# Patient Record
Sex: Male | Born: 1970 | Race: Black or African American | Hispanic: No | Marital: Single | Smoking: Never smoker
Health system: Southern US, Community
[De-identification: ages and names within clinical notes are randomized; demographics above are authoritative.]

## PROBLEM LIST (undated history)

## (undated) DIAGNOSIS — I10 Essential (primary) hypertension: Secondary | ICD-10-CM

## (undated) DIAGNOSIS — Z8679 Personal history of other diseases of the circulatory system: Secondary | ICD-10-CM

## (undated) DIAGNOSIS — M329 Systemic lupus erythematosus, unspecified: Secondary | ICD-10-CM

## (undated) DIAGNOSIS — N189 Chronic kidney disease, unspecified: Secondary | ICD-10-CM

## (undated) HISTORY — DX: Systemic lupus erythematosus, unspecified: M32.9

## (undated) HISTORY — PX: CHOLECYSTECTOMY: SHX55

## (undated) HISTORY — DX: Personal history of other diseases of the circulatory system: Z86.79

## (undated) HISTORY — DX: Essential (primary) hypertension: I10

## (undated) HISTORY — DX: Chronic kidney disease, unspecified: N18.9

---

## 1999-07-08 ENCOUNTER — Emergency Department (HOSPITAL_COMMUNITY): Admission: EM | Admit: 1999-07-08 | Discharge: 1999-07-08 | Payer: Self-pay | Admitting: Emergency Medicine

## 1999-07-09 ENCOUNTER — Encounter: Payer: Self-pay | Admitting: Emergency Medicine

## 1999-11-28 ENCOUNTER — Emergency Department (HOSPITAL_COMMUNITY): Admission: EM | Admit: 1999-11-28 | Discharge: 1999-11-28 | Payer: Self-pay | Admitting: Emergency Medicine

## 1999-11-28 ENCOUNTER — Encounter: Payer: Self-pay | Admitting: Emergency Medicine

## 2014-07-26 ENCOUNTER — Encounter: Payer: Self-pay | Admitting: *Deleted

## 2014-07-26 NOTE — PMR Pre-admission (Signed)
[facility] PMR Admission Coordinator Pre-Admission Assessment  Patient: Thomas Andrews is an 43 y.o., male MRN: [id number] DOB: 02/17/1971 Height: 5' 10" (177.8 cm) Weight: 81.647 kg (180 lb)  Insurance Information HMO:     PPO: yes     PCP:      IPA:      80/20:      OTHER:  PRIMARY: [place]      Policy#: [id number]      Subscriber: self CM Name: Phoebe Sharps RN      Phone#: [phone], ext. [phone]     Fax#: [phone] Authorization given for seven days on 07-26-14 from Deb/Paula. However, pt had further medical issues and was admitted to inpatient rehab on 07-31-14. I then spoke with Joelene Millin, RN on 07-31-14 to share updates on pt status and authorization details. Authorization confirmed with Nevin Bloodgood via Joelene Millin and now approval is from 07-31-14 through 08-07-14 with updates due to [place] on 08-07-14 at above fax. Pre-Cert#: [id number]      Employer: [employer] Benefits:  Phone #: [phone]     Name: Bo Eff. Date: 10-26-13     Deduct: $750 (met all)      Out of Pocket Max: [phone] (met $717.42)      Life Max: unlimited CIR: 80/20%; pre-auth needed      SNF: 80/20%, pre-auth needed Outpatient: 80%     Co-Pay: 20%, 70 visits total for both PT/OT and 20 visits total for SLP Home Health: 80%      Co-Pay: 20%, 100 visit limit, pre-auth needed DME: 80%     Co-Pay: 20% Providers: in network  Emergency Contact Information Contact Information   Name Relation Home Work Bangs Mother [phone]  [phone]      Current Medical History  Patient Admitting Diagnosis: Deconditioning due to acute hypoxic respiratory failure secondary to diffuse alveolar hemorrhage History of Present Illness: This 43 year old male with a history of Lupus, CKD with unknown baseline creatinine and cardiomyopathy presented to [facility] ED on 07-07-14 with shortness of breath and cough for a couple of weeks and scant hemoptysis. Pt had also noted swelling to both lower legs extending to the  thigh. Pt then had acute hypoxic hypercarbic respiratory failure secondary to diffuse alveolar hemorrhage. Pt was intubated twice (last extubated 07-20-14) and had a 3 day course of [place], cyclophosphamide, rituximab and amicar. Pt had no further episodes of hemoptysis since 07-18-14. 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 Pt needed dialysis and last hemodialysis treatment was on 07-24-14 through [facility]. Creatinine has been stable. Pt has chronic HTN that was exacerbated by ARF and SLE flare as well as volume overload. Pt received 3 rounds of PLEX (plasma exchange) and 3 days of pulse dose steroids. On 07-27-14, pt had fever and inpatient rehab admission was delayed. Then, patient needed two units PRBCs and Hgb is now stable.  Pt has been progressing well with therapies and inpatient rehab was recommended.   Patient's medical record from [facility] has been reviewed by the rehabilitation admission coordinator and physician.  Past Medical History  No past medical history on file.  Family History  family history is not on file.  Prior Rehab/Hospitalizations: none   Current Medications  see MAR  Patients Current Diet:  Regular diet  Precautions / Restrictions Precautions Precautions: Fall Restrictions Weight Bearing Restrictions: No   Prior Activity Level Community (5-7x/wk): Pt was independent prior  to getting sick, working full times as a Charity fundraiser in [place]. He is recently divorced and sees his 56 yo daughter several days a week. 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  Home Assistive Devices / Equipment Home Assistive Devices/Equipment: None   Prior Functional Level Current Functional Level  Bed Mobility  Independent  Min assist (HOB elevated, used bed rails and increased time)   Transfers  Independent  Mod assist (Moderate  assist sit to stand, controls descent with UE's, pr)   Mobility - Walk/Wheelchair  Independent  Mod assist (Moderate assist 120' with Swedish walker) Improved to minimal assist with rolling walker  Upper Body Dressing  Independent  Other (overall ADL status from Mod assist to set up)   Lower Body Dressing  Independent  Min assist (Min assist for threading foot through pant hole, cannot stand)   Grooming  Independent  Other (set up assistance)   Eating/Drinking  Independent  Other (set up assistance)   Toilet Transfer  Independent  Mod assist (Moderate assist to [facility])   Bladder Continence   WFL  continent, using urinal   Bowel Management  WFL  continent, last BM on 07-30-14   Stair Climbing   Independent  Other (not assessed, anticipate needs)   Communication  [facility]  [facility]   Memory  [facility]  WFL   Cooking/Meal Prep  Independent      Housework  Independent    Money Management  Independent    Driving   Independent     Special needs/care consideration BiPAP/CPAP no CPM no  Continuous Drip IV no  Dialysis - no (pt's last dialysis was on 07-24-14)         Life Vest no  Oxygen no  Special Bed no  Trach Size no  Wound Vac (area) no       Skin - no issues                               Bowel mgmt: last BM on 07-30-14 Bladder mgmt: currently using urinal Diabetic mgmt no  Previous Home Environment Living Arrangements: Alone  Lives With: Alone Available Help at Discharge: Family;Available 24 hours/day Type of Home: Apartment (3rd floor apartment, plans to be moving out soon) Home Layout: One level Home Access: Stairs to enter Entrance Stairs-Rails: Left Entrance Stairs-Number of Steps: 3 flights to 3rd floor Additional Comments: Pt states he was able to break his apartment lease due to medical issues (lives on 3rd floor apartment), plans to stay with Mom until recovered and then get a new place.  Discharge Living Setting Plans for Discharge Living  Setting: Other (Comment) (plan is to go to mother's house) Type of Home at Discharge: House Discharge Home Layout: One level Discharge Home Access: Stairs to enter Entrance Stairs-Rails: None Entrance Stairs-Number of Steps: 1 Does the patient have any problems obtaining your medications?: No  Social/Family/Support Systems Patient Roles: Parent;Other (Comment) (works full time as Charity fundraiser and runs his own trucking business on the side with a friend) Sport and exercise psychologist Information: mother Dyann Kief is primary contact Anticipated Caregiver: mother Anticipated Ambulance person Information: see above Ability/Limitations of Caregiver: no limitations Caregiver Availability: 24/7 Discharge Plan Discussed with Primary Caregiver: Yes (discussed with Mom and pt on 07-26-14, 07-27-14, 07-30-14 and 07-31-14) Is Caregiver In Agreement with Plan?: Yes Does Caregiver/Family have Issues with Lodging/Transportation  while Pt is in Rehab?: No  Goals/Additional Needs Patient/Family Goal for Rehab: Supervision and Mod Ind with PT/OT; NA for SLP Expected length of stay: 10-14 days Cultural Considerations: none Dietary Needs: regular diet Equipment Needs: to be determined Pt/Family Agrees to Admission and willing to participate: Yes Program Orientation Provided & Reviewed with Pt/Caregiver Including Roles  & Responsibilities: Yes  Patient Condition: I met with pt and his mother at [facility] on 07-26-14 to explain the possibility of acute inpatient rehab. 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 He has a history of lupus and has had an involved medical course involving respiratory/renal issues since being admitted to [facility] on 07-07-14. Pt is currently needing minimal help with bed mobility and moderate assistance to stand and ambulate with a Swedish walker. He is demonstrating  proximal muscle weakness in his legs making the sit to stand transition difficult. He is also needing minimal to moderate help with self care tasks due to generalized weakness. Patient will benefit greatly from the multi-disciplinary team of skilled PT, OT, SLP and rehab nursing to maximize his functional return following this involved medical course of respiratory failure and renal issues. PT, OT and rehab nursing will focus on increasing strength for greater independence with bed mobility, transfers, gait and self care tasks. In addition, rehab nursing can educate pt/family on medication needs and any issues with bowel/bladder training. Pt will benefit from rehab physician intervention to monitor his renal function and overall medical status in light of lupus. Discussed case with Dr. Swartz who feels that pt is a good inpatient rehab candidate. Pt and his mother are motivated to come to inpatient rehab and will benefit from the intensive services of skilled therapy under rehab physician guidance. Pt will be admitted today on   Preadmission Screen Completed By:  Janine Turnington, PT, 07/31/2014 1111am ______________________________________________________________________   Discussed status with Dr. Swartz on 07-31-14 at 1111 and received telephone approval for admission today.  Admission Coordinator:  Janine Turnington, PT, time 1111/Date 07-31-14   Assessment/Plan: Diagnosis: debilitation after respiratory failure and multiple other medical problems 1. Does the need for close, 24 hr/day  Medical supervision in concert with the patient's rehab needs make it unreasonable for this patient to be served in a less intensive setting? Yes 2. Co-Morbidities requiring supervision/potential complications: ckd, lups, CM 3. Due to bladder management, bowel management, safety, skin/wound care, disease management, medication administration, pain management and patient education, does the patient require 24 hr/day rehab  nursing? Yes 4. Does the patient require coordinated care of a physician, rehab nurse, PT (1-2 hrs/day, 5 days/week) and OT (1-2 hrs/day, 5 days/week) to address physical and functional deficits in the context of the above medical diagnosis(es)? Yes Addressing deficits in the following areas: balance, endurance, locomotion, strength, transferring, bowel/bladder control, bathing, dressing, feeding, grooming, toileting and psychosocial support 5. Can the patient actively participate in an intensive therapy program of at least 3 hrs of therapy 5 days a week? Yes 6. The potential for patient to make measurable gains while on inpatient rehab is excellent 7. Anticipated functional outcomes upon discharge from inpatients are: modified independent PT, modified independent OT, n/a SLP 8. Estimated rehab length of stay to reach the above functional goals is: 10-14 days 9. Does the patient have adequate social supports to accommodate these discharge functional goals? Yes 10. Anticipated D/C setting: Home 11. Anticipated   post D/C treatments: HH therapy and Outpatient therapy 12. Overall Rehab/Functional Prognosis: excellent    RECOMMENDATIONS: This patient's condition is appropriate for continued rehabilitative care in the following setting: CIR Patient has agreed to participate in recommended program. Yes Note that insurance prior authorization may be required for reimbursement for recommended care.  Comment: Admit to inpatient rehab today  Zachary T. Swartz, MD, FAAPMR [place] [facility] 07/31/2014   Janine Turnington, PT 07/31/2014 

## 2014-07-31 ENCOUNTER — Other Ambulatory Visit: Payer: Self-pay | Admitting: Physical Medicine and Rehabilitation

## 2014-07-31 ENCOUNTER — Inpatient Hospital Stay (HOSPITAL_COMMUNITY)
Admission: RE | Admit: 2014-07-31 | Discharge: 2014-08-01 | DRG: 947 | Disposition: A | Discharge status: Other Acute Inpatient Hospital | Payer: BC Managed Care – PPO | Source: Other Acute Inpatient Hospital | Attending: Physical Medicine & Rehabilitation | Admitting: Physical Medicine & Rehabilitation

## 2014-07-31 ENCOUNTER — Encounter: Payer: Self-pay | Admitting: Physical Medicine and Rehabilitation

## 2014-07-31 DIAGNOSIS — I129 Hypertensive chronic kidney disease with stage 1 through stage 4 chronic kidney disease, or unspecified chronic kidney disease: Secondary | ICD-10-CM

## 2014-07-31 DIAGNOSIS — M3213 Lung involvement in systemic lupus erythematosus: Secondary | ICD-10-CM

## 2014-07-31 DIAGNOSIS — D61818 Other pancytopenia: Secondary | ICD-10-CM

## 2014-07-31 DIAGNOSIS — D696 Thrombocytopenia, unspecified: Secondary | ICD-10-CM | POA: Diagnosis present

## 2014-07-31 DIAGNOSIS — N189 Chronic kidney disease, unspecified: Secondary | ICD-10-CM

## 2014-07-31 DIAGNOSIS — E43 Unspecified severe protein-calorie malnutrition: Secondary | ICD-10-CM | POA: Diagnosis not present

## 2014-07-31 DIAGNOSIS — N179 Acute kidney failure, unspecified: Secondary | ICD-10-CM | POA: Diagnosis present

## 2014-07-31 DIAGNOSIS — B441 Other pulmonary aspergillosis: Secondary | ICD-10-CM | POA: Diagnosis present

## 2014-07-31 DIAGNOSIS — M3214 Glomerular disease in systemic lupus erythematosus: Secondary | ICD-10-CM | POA: Diagnosis present

## 2014-07-31 DIAGNOSIS — R0489 Hemorrhage from other sites in respiratory passages: Secondary | ICD-10-CM | POA: Diagnosis not present

## 2014-07-31 DIAGNOSIS — M329 Systemic lupus erythematosus, unspecified: Secondary | ICD-10-CM

## 2014-07-31 DIAGNOSIS — R042 Hemoptysis: Secondary | ICD-10-CM

## 2014-07-31 DIAGNOSIS — D62 Acute posthemorrhagic anemia: Secondary | ICD-10-CM | POA: Diagnosis present

## 2014-07-31 DIAGNOSIS — M3219 Other organ or system involvement in systemic lupus erythematosus: Secondary | ICD-10-CM

## 2014-07-31 DIAGNOSIS — R0902 Hypoxemia: Secondary | ICD-10-CM

## 2014-07-31 DIAGNOSIS — J679 Hypersensitivity pneumonitis due to unspecified organic dust: Secondary | ICD-10-CM | POA: Diagnosis not present

## 2014-07-31 DIAGNOSIS — R5381 Other malaise: Secondary | ICD-10-CM | POA: Diagnosis present

## 2014-07-31 DIAGNOSIS — R339 Retention of urine, unspecified: Secondary | ICD-10-CM | POA: Diagnosis not present

## 2014-07-31 LAB — GLUCOSE, CAPILLARY
Glucose-Capillary: 136 mg/dL — ABNORMAL HIGH (ref 70–99)
Glucose-Capillary: 159 mg/dL — ABNORMAL HIGH (ref 70–99)

## 2014-07-31 MED ORDER — ACETAMINOPHEN 325 MG PO TABS: 325.0000 mg | ORAL_TABLET | ORAL | Status: DC | PRN

## 2014-07-31 MED ORDER — POLYETHYLENE GLYCOL 3350 17 G PO PACK
17.0000 g | PACK | Freq: Every day | ORAL | Status: DC
  Administered 2014-07-31 – 2014-08-01 (×2): 17 g via ORAL
  Filled 2014-07-31 (×3): qty 1

## 2014-07-31 MED ORDER — PREDNISONE 50 MG PO TABS: 50.0000 mg | ORAL_TABLET | Freq: Every day | ORAL | Status: DC

## 2014-07-31 MED ORDER — HYDRALAZINE HCL 50 MG PO TABS
50.0000 mg | ORAL_TABLET | Freq: Three times a day (TID) | ORAL | Status: DC
  Administered 2014-07-31 – 2014-08-01 (×5): 50 mg via ORAL
  Filled 2014-07-31 (×6): qty 1

## 2014-07-31 MED ORDER — ATOVAQUONE 750 MG/5ML PO SUSP
1500.0000 mg | Freq: Every day | ORAL | Status: DC
  Administered 2014-08-01: 1500 mg via ORAL
  Filled 2014-07-31 (×2): qty 10

## 2014-07-31 MED ORDER — BISACODYL 10 MG RE SUPP: 10.0000 mg | Freq: Every day | RECTAL | Status: DC | PRN

## 2014-07-31 MED ORDER — HYDROXYCHLOROQUINE SULFATE 200 MG PO TABS
400.0000 mg | ORAL_TABLET | Freq: Every day | ORAL | Status: DC
  Administered 2014-08-01: 400 mg via ORAL
  Filled 2014-07-31 (×2): qty 2

## 2014-07-31 MED ORDER — ALBUTEROL SULFATE (2.5 MG/3ML) 0.083% IN NEBU
2.5000 mg | INHALATION_SOLUTION | RESPIRATORY_TRACT | Status: DC | PRN
  Administered 2014-08-01: 2.5 mg via RESPIRATORY_TRACT
  Filled 2014-07-31 (×2): qty 3

## 2014-07-31 MED ORDER — FLEET ENEMA 7-19 GM/118ML RE ENEM: 1.0000 | ENEMA | Freq: Once | RECTAL | Status: AC | PRN

## 2014-07-31 MED ORDER — INSULIN ASPART 100 UNIT/ML ~~LOC~~ SOLN: 0.0000 [IU] | Freq: Every day | SUBCUTANEOUS | Status: DC

## 2014-07-31 MED ORDER — CALCIUM CARBONATE-VITAMIN D 500-200 MG-UNIT PO TABS
1.0000 | ORAL_TABLET | Freq: Every day | ORAL | Status: DC
  Administered 2014-08-01: 1 via ORAL
  Filled 2014-07-31 (×2): qty 1

## 2014-07-31 MED ORDER — AMLODIPINE BESYLATE 10 MG PO TABS
10.0000 mg | ORAL_TABLET | Freq: Every day | ORAL | Status: DC
  Administered 2014-08-01: 10 mg via ORAL
  Filled 2014-07-31 (×2): qty 1

## 2014-07-31 MED ORDER — PREDNISONE 50 MG PO TABS
80.0000 mg | ORAL_TABLET | Freq: Every day | ORAL | Status: DC
  Administered 2014-08-01: 80 mg via ORAL
  Filled 2014-07-31 (×2): qty 1

## 2014-07-31 MED ORDER — GUAIFENESIN-DM 100-10 MG/5ML PO SYRP
5.0000 mL | ORAL_SOLUTION | Freq: Four times a day (QID) | ORAL | Status: DC | PRN
  Administered 2014-08-01: 10 mL via ORAL
  Filled 2014-07-31: qty 10

## 2014-07-31 MED ORDER — LEVOTHYROXINE SODIUM 150 MCG PO TABS
150.0000 ug | ORAL_TABLET | Freq: Every day | ORAL | Status: DC
Start: 2014-08-01 — End: 2014-08-01
  Administered 2014-08-01: 150 ug via ORAL
  Filled 2014-07-31 (×2): qty 1

## 2014-07-31 MED ORDER — VORICONAZOLE 200 MG PO TABS
200.0000 mg | ORAL_TABLET | Freq: Two times a day (BID) | ORAL | Status: DC
  Administered 2014-07-31 – 2014-08-01 (×3): 200 mg via ORAL
  Filled 2014-07-31 (×4): qty 1

## 2014-07-31 MED ORDER — ONDANSETRON HCL 4 MG PO TABS: 4.0000 mg | ORAL_TABLET | Freq: Four times a day (QID) | ORAL | Status: DC | PRN

## 2014-07-31 MED ORDER — PREDNISONE 50 MG PO TABS: 70.0000 mg | ORAL_TABLET | Freq: Every day | ORAL | Status: DC

## 2014-07-31 MED ORDER — INSULIN ASPART 100 UNIT/ML ~~LOC~~ SOLN
0.0000 [IU] | Freq: Three times a day (TID) | SUBCUTANEOUS | Status: DC
  Administered 2014-08-01: 2 [IU] via SUBCUTANEOUS

## 2014-07-31 MED ORDER — CARVEDILOL 25 MG PO TABS
25.0000 mg | ORAL_TABLET | Freq: Two times a day (BID) | ORAL | Status: DC
  Administered 2014-07-31 – 2014-08-01 (×3): 25 mg via ORAL
  Filled 2014-07-31 (×5): qty 1

## 2014-07-31 MED ORDER — ALUM & MAG HYDROXIDE-SIMETH 200-200-20 MG/5ML PO SUSP: 30.0000 mL | ORAL | Status: DC | PRN

## 2014-07-31 MED ORDER — ONDANSETRON HCL 4 MG/2ML IJ SOLN: 4.0000 mg | Freq: Four times a day (QID) | INTRAMUSCULAR | Status: DC | PRN

## 2014-07-31 MED ORDER — TRAZODONE HCL 50 MG PO TABS
25.0000 mg | ORAL_TABLET | Freq: Every evening | ORAL | Status: DC | PRN
Start: 2014-07-31 — End: 2014-08-01
  Administered 2014-07-31: 50 mg via ORAL
  Filled 2014-07-31: qty 1

## 2014-07-31 MED ORDER — SENNOSIDES-DOCUSATE SODIUM 8.6-50 MG PO TABS
2.0000 | ORAL_TABLET | Freq: Two times a day (BID) | ORAL | Status: DC
  Administered 2014-08-01: 2 via ORAL
  Filled 2014-07-31 (×2): qty 2

## 2014-07-31 MED ORDER — SODIUM CHLORIDE 0.9 % IV SOLN: 1000.0000 mg | Freq: Once | INTRAVENOUS | Status: DC

## 2014-07-31 MED ORDER — PANTOPRAZOLE SODIUM 40 MG PO TBEC
40.0000 mg | DELAYED_RELEASE_TABLET | Freq: Every day | ORAL | Status: DC
  Administered 2014-08-01: 40 mg via ORAL
  Filled 2014-07-31: qty 1

## 2014-07-31 MED ORDER — DOXAZOSIN MESYLATE 2 MG PO TABS
2.0000 mg | ORAL_TABLET | Freq: Two times a day (BID) | ORAL | Status: DC
  Administered 2014-07-31 – 2014-08-01 (×3): 2 mg via ORAL
  Filled 2014-07-31 (×4): qty 1

## 2014-07-31 MED ORDER — ALBUTEROL SULFATE (2.5 MG/3ML) 0.083% IN NEBU: 2.5000 mg | INHALATION_SOLUTION | Freq: Every day | RESPIRATORY_TRACT | Status: DC

## 2014-07-31 MED ORDER — PREDNISONE 50 MG PO TABS: 60.0000 mg | ORAL_TABLET | Freq: Every day | ORAL | Status: DC

## 2014-07-31 NOTE — Progress Notes (Signed)
Patient ID: Sheralyn BoatmanDennis R Slaght, male   DOB: [birth year], 43 y.o.   MRN: [id number] Patient admitted to [phone] via stretcher, escorted by EMS staff.  Patient verbalized understanding of rehab process, signed fall safety plan.  Appears to be in no immediate distress at this time.  Will continue to monitor.  Dani Gobbleeardon, Hakeen Shipes J, RN

## 2014-07-31 NOTE — H&P (Signed)
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 Thomas Andrews is a 43 year old male with history of SLE with multisystem involvement who was admitted to [facility] on 07/07/14 with several weeks of SOB, BLE edema progressive to blood tinged productive cough, fever and chills. Patient reported to be on recent steroid taper by his rheumatologist (Dr. Janne Lab). He was started on IV antibiotics for patchy airspace disease and IV diuretics for edema. CT chest showed multifocal areas of consolidation and requires intubation with high frequency oscillation for respiratory failure. Bronchoscopy showed at bedside revealed diffuse blood staining the airways and he was started on high dose steroids for diffuse alveolar hemorrhage. Rheumatology was consulted for input and home azathioprine was discontinued and patient was treated with cytoxan 09/17 with plans to repeat every 4 weeks. 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 He was treated with high doses of prednisone with recommendations for slow taper. He underwent PLEX 9/15-9/18 and tolerated extubation on 09/19 and IV antibiotics were discontinued due to low suspicion of infection.  On 09/23, he developed acute worsening of DAH requiring re-intubation and 3 round of PLEX as well as Rituximab on 09/26 with recommendations for second dose in 2 weeks. Acute on chronic renal failure treated with CVVHD to iHD with improvement in renal status and UOP. Dialysis was discontinued by end of September with reports of good urine output and recommendations to utilize lasix prn for any volume issues. Renal biopsy was done on 9/30. 2D echo done revealing EF 50% with mild LVH and mild RV dysfunction. He started having fevers on 10/01 and cultures of bronch specimen  grew out aspergillus on 10/02 and patient also had candida in urine as well as oral thrush. Although though to be a contaminant due to immuno suppresion ID/Dr. Elie Goody recommended treating patient with Voriconazole 200mg  bid for at least 12 weeks with weekly monitoring of trough levels.  Blood pressures have been managed on oral medications and nicardipine drip weaned off. Recommendation for Rituximab 1000 mg on 10/09 and Cyclophosphamide 450 mg on 10/17 at [facility] infusion clinic. Plans for slow prednisone taper by 10 mg ever two weeks--next decrease to 70 mg/day on 10/14. He is to continue on PJP prophylaxis with bactrim DS MWF, plaquenil 400 mg/day and CCB for Raynaud's. Swallow evaluation done 09/30 showing no evidence of dysphagia and patient tolerating regular diet with aspiration precautions. Had pancytopenia with platelets down to 45, ANC 2.2 as well as drop in H/H to 6.7 on 10/03 and transfused with 2 units PRBC. Pancytopenia expected as SE of cytoxan and to transfuse platelets if <10. Hypocalcemia treated with calcium supplements. Therapy ongoing and patient showing improvement in tolerance and activity level. CIR recommended by rehab team for further therapy.   Review of Systems  HENT: Negative for ear discharge and hearing loss.  Eyes: Negative for blurred vision and double vision.  Respiratory: Negative for cough and shortness of breath.  Cardiovascular: Positive for leg swelling. Negative for chest pain and palpitations.  Gastrointestinal: Positive for abdominal pain (abdominal distension). Negative for heartburn, nausea and constipation.  Genitourinary: Negative for dysuria and hematuria.  Musculoskeletal: Positive for myalgias.  Neurological: Positive for focal weakness. Negative for sensory change and headaches.  Psychiatric/Behavioral: The patient is not nervous/anxious and does not have insomnia.   Past Medical  History   Diagnosis  Date   .  Systemic lupus erythematosus      with  pulmonary, renal, cardiac, skin and joint involvement   .  HTN (hypertension)    .  CKD (chronic kidney disease)    .  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  Cholecystectomy      No family history on file.  Social History: Lives alone in [place]. Works full time and independent PTA. Plans for discharge to mother's home. He does not use tobacco, alcohol, or illicit drugs.  Allergies   Allergen  Reactions   .  Levaquin [Levofloxacin In D5w]  Other (See Comments)     Rash and SOB     (Not in a hospital admission)  Home:  Functional History:  Lives alone. Works as a Nurse, adultmanufacturing engineer in a factory.  Functional Status:  Mobility:  Min assist for bed mobility  Moderate assist for transfer  Able to ambulate 6 feet with min assist and swedish walker.  ADL:  Moderate assist to set up overall  Min assist for lower body dressing.  Cognition:  Alert and oriented X 4.  Follow commands without difficulty.  Physical Exam:  There were no vitals taken for this visit.   Constitutional: He is oriented to person, place, and time. He appears well-developed and well-nourished.  HENT:  Head: Normocephalic and atraumatic.  Eyes: Conjunctivae and EOM are normal. Pupils are equal, round, and reactive to light.  Neck: Normal range of motion. Neck supple.  Cardiovascular: Regular rhythm. Tachycardia present.  Respiratory: Effort normal. No respiratory distress. He has decreased breath sounds in the right lower field and the left lower field. He has no wheezes. He has no rhonchi.  GI: Soft. Bowel sounds are normal. He exhibits no distension. There is no tenderness.  Musculoskeletal: He exhibits edema (2+ pedal edema. Trace to 1+ pitting edema from knees to ankles. ).  Neurological: He is alert and oriented to person, place, and time.  RUE: deltoid 5/5, bicep 5/5, tricep 5/5, wrist ext 5/5, hand intrinsics 5/5 LUE: deltoid 5/5, bicep 5/5,  triceps 5/5, wrist ext 5/5, hand instrincs 5/5 RLE: HF 2/5, KE 4-/5, KF 3+/5, ADF 4/5, APF 4/5 LLE: HF 2/5, KE 4-/5, HF, 3+/5, ADF 4/5, APF 4/5  Skin: Skin is warm and dry.  No skin breakdown noted. bx site healed.  Psychiatric: He has a normal mood and affect. His behavior is normal. Judgment and thought content normal.   Laboratory results from 07/30/14:  Na-136 K-4.1 Cl-106 Co2- 25 BUN-54 Cr-1.8 Ca- 6.6  AST-114 ALT- 128 Alb-1.9 Aphos-38 TP-0.5  Hgb- 9.7 WBC-3.7 Plt- 57  Medical Problem List and Plan:  1. Functional deficits secondary to Debilitation due to SLE flare with DAH, lupus nephritis and aspergillus lung infection.  2. DVT Prophylaxis/Anticoagulation: Mechanical: Antiembolism stockings, thigh (TED hose) Bilateral lower extremities  3. Pain Management: No complaints of pain reported.  4. Mood: LCSW to follow for evaluation and support.  5. Neuropsych: This patient is capable of making decisions on her own behalf.  6. Skin/Wound Care: Routine pressure relief measures. Monitor for any signs of breakdown.  7. Fluids/Electrolytes/Nutrition: Monitor I/O. Add nutritional supplements due to signs of severe malnutrition.  8. Aspergillus lung infection: Continue Voriconozole bid for 3 months with next next trough on 10/09--will check CBC/CMET in am and then weekly check of trough and CMET  starting 10/16.  9. CKD: Monitor UOP as well as recovery of renal status. Treat BLE edema with diuretics as indicated.  10. HTN: Will monitor every 8 hours. Continue Norvasc, Cardura, Hydralazine and Coreg.  11. SLE: Will continue plaquenil daily with mepron for PCP. Prednisone 80 mg till 10/15 then 70 mg X 2 weeks, etc for slow taper. Next dose of Rituxan on Friday and Ok to administer at [facility]. Next dose cytoxan on 10/15  12. Pancytopenia: Will monitor with routine check. Recheck CBC in am and treat appropriately.  13. Urinary retention: Will monitor PVRs. Cath for volumes > 350 cc. Bladder scan > 260 cc  but only 50 cc output--?ascites.  Post Admission Physician Evaluation:  1. Functional deficits secondary to debilitation due to SLE flare. 2. Patient is admitted to receive collaborative, interdisciplinary care between the physiatrist, rehab nursing staff, and therapy team. 3. Patient's level of medical complexity and substantial therapy needs in context of that medical necessity cannot be provided at a lesser intensity of care such as a SNF. 4. Patient has experienced substantial functional loss from his/her baseline which was documented above under the "Functional History" and "Functional Status" headings. Judging by the patient's diagnosis, physical exam, and functional history, the patient has potential for functional progress which will result in measurable gains while on inpatient rehab. These gains will be of substantial and practical use upon discharge in facilitating mobility and self-care at the household level. 5. Physiatrist will provide 24 hour management of medical needs as well as oversight of the therapy plan/treatment and provide guidance as appropriate regarding the interaction of the two. 6. 24 hour rehab nursing will assist with bladder management, bowel management, safety, skin/wound care, disease management, medication administration, pain management and patient education and help integrate therapy concepts, techniques,education, etc. 7. PT will assess and treat for/with: Lower extremity strength, range of motion, stamina, balance, functional mobility, safety, adaptive techniques and equipment, pain mgt, activity tolerance, . Goals are: mod I. 8. OT will assess and treat for/with: ADL's, functional mobility, safety, upper extremity strength, adaptive techniques and equipment, pain mgt, activity tolerance, leisure awareness. Goals are: mod I. Therapy may proceed with showering this patient. 9. SLP will assess and treat for/with: n/a. Goals are: n/a. 10. Case Management and Social  Worker will assess and treat for psychological issues and discharge planning. 11. Team conference will be held weekly to assess progress toward goals and to determine barriers to discharge. 12. Patient will receive at least 3 hours of therapy per day at least 5 days per week. 13. ELOS: 7-10 days  14. Prognosis: excellent  Ranelle Oyster, MD, [facility] 07/31/2014

## 2014-07-31 NOTE — Progress Notes (Signed)
Patients abdomen distended.  Bladder scanned for >265, had just voided in urinal 150 ml; straight cathed for 50 ml.  Notified Pam Love, PA of findings, ordered to monitor, and allow patient to try voiding again later.  Will continue to monitor.  Dani Gobbleeardon, Dallen Bunte J, RN

## 2014-07-31 NOTE — Progress Notes (Signed)
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 Levaquin [Levofloxacin In D5w] Other (See Comments)    Rash and SOB    Patient Measurements: Weight: 179 lb 6.4 oz (81.375 kg)  Vital Signs: Temp: 98.4 F (36.9 C) (10/06 1700) Temp Source: Oral (10/06 1700) BP: 129/88 mmHg (10/06 1700) Pulse Rate: 81 (10/06 1700) Intake/Output from previous day:   Intake/Output from this shift: Total I/O In: -  Out: 200 [Urine:200]  Labs:     Microbiology: No results found for this or any previous visit (from the past 720 hour(s)).  Medical History: Past Medical History  Diagnosis Date  . Systemic lupus erythematosus     with pulmonary, renal, cardiac, skin and joint involvement  . HTN (hypertension)   . CKD (chronic kidney disease)   . 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 albuterol (PROVENTIL HFA;VENTOLIN HFA) 108 (90 BASE) MCG/ACT inhaler Inhale 2 puffs into the lungs every 6 (six) hours as needed for wheezing or shortness of breath.      Marland Kitchen. amLODipine (NORVASC) 10 MG tablet Take 10 mg by mouth daily.      Marland Kitchen. atovaquone (MEPRON) 750 MG/5ML suspension Take 1,500 mg by mouth daily.      . calcium citrate-vitamin D (CITRACAL+D) 315-200 MG-UNIT per tablet Take 1 tablet by mouth daily.      . carvedilol (COREG) 25 MG tablet Take 25 mg by mouth 2 (two) times daily with a meal.      . doxazosin (CARDURA) 2 MG tablet Take 2 mg by mouth 2 (two) times daily.      . hydrALAZINE (APRESOLINE) 50 MG tablet Take 50 mg by mouth every 8 (eight) hours.      . hydroxychloroquine (PLAQUENIL) 200 MG tablet Take 400 mg by mouth daily.      Marland Kitchen. levothyroxine (SYNTHROID, LEVOTHROID) 150 MCG tablet Take 150 mcg by mouth daily before breakfast.      . Melatonin 3 MG CAPS Take 3 mg by mouth at bedtime.       . pantoprazole (PROTONIX) 40 MG tablet Take 40 mg by mouth daily.      . polyethylene glycol (MIRALAX / GLYCOLAX) packet Take 17 g by mouth daily as needed for moderate constipation.      . predniSONE (DELTASONE) 10 MG tablet Take 50-80 mg by mouth daily with breakfast. Take 80 mg by mouth daily until 08/09/2014. Take 70 mg by mouth daily from 08/10/2014-08/23/2014. Take 60 mg by mouth daily from 08/24/2014-09/06/2014. Take 50 mg by mouth daily from 09/07/2014-09/20/2014.      . sennosides-docusate sodium (SENOKOT-S) 8.6-50 MG tablet Take 2 tablets by mouth 2 (two) times daily.      Marland Kitchen. voriconazole (VFEND) 200 MG tablet Take 200 mg by mouth 2 (two) times daily.       Assessment: 43 yo M [facility] for SLE with multisystem involvement (pulm, cardiac, renal, skin, and joint). Primary complication at time of hospitalization was diffuse alveolar hemorrhage. Info below taken [facility]  1. 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 BAL from 9/13 showed DAH (common in lupus flares). Currently on high-dose steroid taper.   2. SLE with acute flare [facility]  continued home hydroxychloroquine and started on cytoxan 450 mg/m2 on 9/17 (every 4 weeks, next dose 10/17). Started high dose Prednisone, taper 10mg  every 2 weeks. Rituxan 1000mg  on 9/26 with next dose due 10/9. Continue Atovaquone for PJP px, Plaquenil, and CCB for Raynaud's.  **Follow up plan for Rituxan on Friday.  3. 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  4. Aspergillus in bronch washing, concern for systemic fungal infection given chronic immunosuppression. Bronch 9/13 resulted with aspergillus 10/2. ID was consulted and recommended Vfend 200mg  BID x 6-12 weeks. Goal trough 2-4 with weekly troughs and LFTs. Next trough 10/9. Fax labs to [phone] Dr.  Elie Goody.  **I restarted PTA dose. Follow-up plan for levels and [facility] ID to manage?   5. HF (EF 50%) 6. HTN - Amlo, Coreg, Hydral 7. Hypocalcemia - on Ca 8. Anemia  Goal of Therapy:  Goal Trough Level 2-4  Plan:  Voriconazole 200mg  PO BID. Follow up plans for levels (due 10/9) and fax to Dr. Lendon Colonel Follow up plans for Rituxan infusion on 10/9 per [facility]ology recs   [facility], Pharm.D., BCPS Clinical Pharmacist Pager [phone] 07/31/2014 5:42 PM

## 2014-08-01 ENCOUNTER — Inpatient Hospital Stay (HOSPITAL_COMMUNITY): Payer: BC Managed Care – PPO | Admitting: Occupational Therapy

## 2014-08-01 ENCOUNTER — Inpatient Hospital Stay (HOSPITAL_COMMUNITY): Payer: BC Managed Care – PPO | Admitting: Physical Therapy

## 2014-08-01 ENCOUNTER — Inpatient Hospital Stay (HOSPITAL_COMMUNITY)
Admission: AD | Admit: 2014-08-01 | Discharge: 2014-08-05 | DRG: 545 | Disposition: A | Discharge status: Other Acute Inpatient Hospital | Payer: BC Managed Care – PPO | Source: Ambulatory Visit | Attending: Pulmonary Disease | Admitting: Pulmonary Disease

## 2014-08-01 ENCOUNTER — Inpatient Hospital Stay (HOSPITAL_COMMUNITY): Payer: BC Managed Care – PPO

## 2014-08-01 DIAGNOSIS — M3213 Lung involvement in systemic lupus erythematosus: Principal | ICD-10-CM | POA: Diagnosis present

## 2014-08-01 DIAGNOSIS — R0489 Hemorrhage from other sites in respiratory passages: Secondary | ICD-10-CM | POA: Diagnosis present

## 2014-08-01 DIAGNOSIS — J679 Hypersensitivity pneumonitis due to unspecified organic dust: Secondary | ICD-10-CM | POA: Diagnosis not present

## 2014-08-01 DIAGNOSIS — G9341 Metabolic encephalopathy: Secondary | ICD-10-CM | POA: Diagnosis present

## 2014-08-01 DIAGNOSIS — N179 Acute kidney failure, unspecified: Secondary | ICD-10-CM | POA: Diagnosis present

## 2014-08-01 DIAGNOSIS — R042 Hemoptysis: Secondary | ICD-10-CM

## 2014-08-01 DIAGNOSIS — D689 Coagulation defect, unspecified: Secondary | ICD-10-CM | POA: Diagnosis present

## 2014-08-01 DIAGNOSIS — D696 Thrombocytopenia, unspecified: Secondary | ICD-10-CM | POA: Diagnosis present

## 2014-08-01 DIAGNOSIS — Z9289 Personal history of other medical treatment: Secondary | ICD-10-CM

## 2014-08-01 DIAGNOSIS — D62 Acute posthemorrhagic anemia: Secondary | ICD-10-CM | POA: Diagnosis present

## 2014-08-01 DIAGNOSIS — M3214 Glomerular disease in systemic lupus erythematosus: Secondary | ICD-10-CM | POA: Diagnosis present

## 2014-08-01 DIAGNOSIS — B441 Other pulmonary aspergillosis: Secondary | ICD-10-CM | POA: Diagnosis present

## 2014-08-01 DIAGNOSIS — N189 Chronic kidney disease, unspecified: Secondary | ICD-10-CM

## 2014-08-01 DIAGNOSIS — J9690 Respiratory failure, unspecified, unspecified whether with hypoxia or hypercapnia: Secondary | ICD-10-CM

## 2014-08-01 DIAGNOSIS — N183 Chronic kidney disease, stage 3 (moderate): Secondary | ICD-10-CM | POA: Diagnosis present

## 2014-08-01 DIAGNOSIS — M3219 Other organ or system involvement in systemic lupus erythematosus: Secondary | ICD-10-CM | POA: Diagnosis present

## 2014-08-01 DIAGNOSIS — R5381 Other malaise: Secondary | ICD-10-CM | POA: Diagnosis not present

## 2014-08-01 DIAGNOSIS — M329 Systemic lupus erythematosus, unspecified: Secondary | ICD-10-CM | POA: Diagnosis not present

## 2014-08-01 DIAGNOSIS — Z01818 Encounter for other preprocedural examination: Secondary | ICD-10-CM

## 2014-08-01 DIAGNOSIS — J9601 Acute respiratory failure with hypoxia: Secondary | ICD-10-CM | POA: Diagnosis present

## 2014-08-01 DIAGNOSIS — Z452 Encounter for adjustment and management of vascular access device: Secondary | ICD-10-CM

## 2014-08-01 DIAGNOSIS — I5022 Chronic systolic (congestive) heart failure: Secondary | ICD-10-CM | POA: Diagnosis present

## 2014-08-01 DIAGNOSIS — R609 Edema, unspecified: Secondary | ICD-10-CM

## 2014-08-01 DIAGNOSIS — J690 Pneumonitis due to inhalation of food and vomit: Secondary | ICD-10-CM | POA: Diagnosis not present

## 2014-08-01 DIAGNOSIS — I129 Hypertensive chronic kidney disease with stage 1 through stage 4 chronic kidney disease, or unspecified chronic kidney disease: Secondary | ICD-10-CM | POA: Diagnosis present

## 2014-08-01 DIAGNOSIS — D638 Anemia in other chronic diseases classified elsewhere: Secondary | ICD-10-CM | POA: Diagnosis present

## 2014-08-01 DIAGNOSIS — I5041 Acute combined systolic (congestive) and diastolic (congestive) heart failure: Secondary | ICD-10-CM

## 2014-08-01 LAB — GLUCOSE, CAPILLARY
GLUCOSE-CAPILLARY: 118 mg/dL — AB (ref 70–99)
Glucose-Capillary: 91 mg/dL (ref 70–99)
Glucose-Capillary: 161 mg/dL — ABNORMAL HIGH (ref 70–99)
Glucose-Capillary: 165 mg/dL — ABNORMAL HIGH (ref 70–99)

## 2014-08-01 LAB — COMPREHENSIVE METABOLIC PANEL
ALK PHOS: 47 U/L (ref 39–117)
ALT: 165 U/L — AB (ref 0–53)
AST: 151 U/L — ABNORMAL HIGH (ref 0–37)
Albumin: 2 g/dL — ABNORMAL LOW (ref 3.5–5.2)
Anion gap: 9 (ref 5–15)
BUN: 60 mg/dL — ABNORMAL HIGH (ref 6–23)
CO2: 24 meq/L (ref 19–32)
Calcium: 6.3 mg/dL — CL (ref 8.4–10.5)
Chloride: 105 mEq/L (ref 96–112)
Creatinine, Ser: 1.91 mg/dL — ABNORMAL HIGH (ref 0.50–1.35)
GFR calc Af Amer: 49 mL/min — ABNORMAL LOW (ref >90)
GFR, EST NON AFRICAN AMERICAN: 42 mL/min — AB (ref >90)
GLUCOSE: 89 mg/dL (ref 70–99)
POTASSIUM: 4.9 meq/L (ref 3.7–5.3)
Sodium: 138 mEq/L (ref 137–147)
Total Bilirubin: 0.3 mg/dL (ref 0.3–1.2)
Total Protein: 4.8 g/dL — ABNORMAL LOW (ref 6.0–8.3)

## 2014-08-01 LAB — BLOOD GAS, ARTERIAL
ACID-BASE DEFICIT: 3.8 mmol/L — AB (ref 0.0–2.0)
Bicarbonate: 19.7 mEq/L — ABNORMAL LOW (ref 20.0–24.0)
Drawn by: 252031
FIO2: 1 %
O2 SAT: 85.2 %
PO2 ART: 52 mmHg — AB (ref 80.0–100.0)
Patient temperature: 98.6
TCO2: 20.6 mmol/L (ref 0–100)
pCO2 arterial: 29.7 mmHg — ABNORMAL LOW (ref 35.0–45.0)
pH, Arterial: 7.437 (ref 7.350–7.450)

## 2014-08-01 LAB — CBC WITH DIFFERENTIAL/PLATELET
Basophils Absolute: 0 10*3/uL (ref 0.0–0.1)
Basophils Relative: 0 % (ref 0–1)
EOS ABS: 0 10*3/uL (ref 0.0–0.7)
EOS PCT: 0 % (ref 0–5)
HCT: 29.5 % — ABNORMAL LOW (ref 39.0–52.0)
HEMOGLOBIN: 9.8 g/dL — AB (ref 13.0–17.0)
LYMPHS ABS: 0.1 10*3/uL — AB (ref 0.7–4.0)
Lymphocytes Relative: 3 % — ABNORMAL LOW (ref 12–46)
MCH: 27.8 pg (ref 26.0–34.0)
MCHC: 33.2 g/dL (ref 30.0–36.0)
MCV: 83.6 fL (ref 78.0–100.0)
MONOS PCT: 2 % — AB (ref 3–12)
Monocytes Absolute: 0.1 10*3/uL (ref 0.1–1.0)
Neutro Abs: 3.8 10*3/uL (ref 1.7–7.7)
Neutrophils Relative %: 95 % — ABNORMAL HIGH (ref 43–77)
Platelets: 71 10*3/uL — ABNORMAL LOW (ref 150–400)
RBC: 3.53 MIL/uL — ABNORMAL LOW (ref 4.22–5.81)
RDW: 18.4 % — ABNORMAL HIGH (ref 11.5–15.5)
WBC: 4 10*3/uL (ref 4.0–10.5)

## 2014-08-01 LAB — CALCIUM, IONIZED: Calcium, Ion: 0.97 mmol/L — ABNORMAL LOW (ref 1.12–1.23)

## 2014-08-01 MED ORDER — ATOVAQUONE 750 MG/5ML PO SUSP
1500.0000 mg | Freq: Every day | ORAL | Status: DC
  Administered 2014-08-02 – 2014-08-05 (×4): 1500 mg
  Filled 2014-08-01 (×5): qty 10

## 2014-08-01 MED ORDER — PANTOPRAZOLE SODIUM 40 MG IV SOLR
40.0000 mg | Freq: Every day | INTRAVENOUS | Status: DC
  Administered 2014-08-02 – 2014-08-04 (×3): 40 mg via INTRAVENOUS
  Filled 2014-08-01 (×3): qty 40

## 2014-08-01 MED ORDER — LEVOTHYROXINE SODIUM 100 MCG IV SOLR
75.0000 ug | Freq: Every day | INTRAVENOUS | Status: DC
  Administered 2014-08-02 – 2014-08-04 (×3): 75 ug via INTRAVENOUS
  Filled 2014-08-01 (×3): qty 5

## 2014-08-01 MED ORDER — SENNA 8.6 MG PO TABS
2.0000 | ORAL_TABLET | Freq: Two times a day (BID) | ORAL | Status: DC
  Filled 2014-08-01: qty 2

## 2014-08-01 MED ORDER — MIDAZOLAM HCL 2 MG/2ML IJ SOLN
INTRAMUSCULAR | Status: AC
  Administered 2014-08-02: 2 mg via INTRAVENOUS
  Filled 2014-08-01: qty 2

## 2014-08-01 MED ORDER — METHYLPREDNISOLONE SODIUM SUCC 125 MG IJ SOLR
60.0000 mg | Freq: Four times a day (QID) | INTRAMUSCULAR | Status: DC
  Filled 2014-08-01 (×2): qty 0.96

## 2014-08-01 MED ORDER — ALBUTEROL SULFATE (2.5 MG/3ML) 0.083% IN NEBU
2.5000 mg | INHALATION_SOLUTION | Freq: Every day | RESPIRATORY_TRACT | Status: DC
  Administered 2014-08-01: 2.5 mg via RESPIRATORY_TRACT
  Filled 2014-08-01: qty 3

## 2014-08-01 MED ORDER — IPRATROPIUM-ALBUTEROL 0.5-2.5 (3) MG/3ML IN SOLN
3.0000 mL | Freq: Four times a day (QID) | RESPIRATORY_TRACT | Status: DC
Start: 2014-08-02 — End: 2014-08-05
  Administered 2014-08-02 – 2014-08-05 (×15): 3 mL via RESPIRATORY_TRACT
  Filled 2014-08-01 (×16): qty 3

## 2014-08-01 MED ORDER — FENTANYL CITRATE 0.05 MG/ML IJ SOLN
100.0000 ug | INTRAMUSCULAR | Status: DC | PRN
Start: 2014-08-01 — End: 2014-08-02
  Filled 2014-08-01: qty 2

## 2014-08-01 MED ORDER — SODIUM CHLORIDE 0.9 % IV SOLN: 250.0000 mL | INTRAVENOUS | Status: DC | PRN

## 2014-08-01 MED ORDER — PROPOFOL 10 MG/ML IV EMUL
INTRAVENOUS | Status: AC
  Administered 2014-08-01: 10 ug/kg/min via INTRAVENOUS
  Filled 2014-08-01: qty 50

## 2014-08-01 MED ORDER — FENTANYL CITRATE 0.05 MG/ML IJ SOLN
INTRAMUSCULAR | Status: AC
  Administered 2014-08-01: 100 ug via INTRAVENOUS
  Filled 2014-08-01: qty 2

## 2014-08-01 MED ORDER — PROPOFOL 10 MG/ML IV EMUL
0.0000 ug/kg/min | INTRAVENOUS | Status: DC
  Administered 2014-08-01: 10 ug/kg/min via INTRAVENOUS
  Administered 2014-08-02: 35 ug/kg/min via INTRAVENOUS
  Administered 2014-08-02 (×2): 25 ug/kg/min via INTRAVENOUS
  Administered 2014-08-02: 35 ug/kg/min via INTRAVENOUS
  Administered 2014-08-03: 15 ug/kg/min via INTRAVENOUS
  Filled 2014-08-01 (×5): qty 100

## 2014-08-01 MED ORDER — MIDAZOLAM HCL 2 MG/2ML IJ SOLN
INTRAMUSCULAR | Status: AC
  Administered 2014-08-01: 2 mg via INTRAVENOUS
  Filled 2014-08-01: qty 2

## 2014-08-01 MED ORDER — CHLORHEXIDINE GLUCONATE 0.12 % MT SOLN
15.0000 mL | Freq: Two times a day (BID) | OROMUCOSAL | Status: DC
  Administered 2014-08-02 – 2014-08-05 (×7): 15 mL via OROMUCOSAL
  Filled 2014-08-01 (×7): qty 15

## 2014-08-01 MED ORDER — ALBUTEROL SULFATE (2.5 MG/3ML) 0.083% IN NEBU: 2.5000 mg | INHALATION_SOLUTION | RESPIRATORY_TRACT | Status: DC | PRN

## 2014-08-01 MED ORDER — SODIUM CHLORIDE 0.9 % IJ SOLN: 3.0000 mL | INTRAMUSCULAR | Status: DC | PRN

## 2014-08-01 MED ORDER — SODIUM CHLORIDE 0.9 % IJ SOLN
3.0000 mL | Freq: Two times a day (BID) | INTRAMUSCULAR | Status: DC
  Administered 2014-08-02 – 2014-08-04 (×4): 3 mL via INTRAVENOUS

## 2014-08-01 MED ORDER — HYDROXYCHLOROQUINE SULFATE 200 MG PO TABS
400.0000 mg | ORAL_TABLET | Freq: Every day | ORAL | Status: DC
  Administered 2014-08-02 – 2014-08-05 (×4): 400 mg
  Filled 2014-08-01 (×4): qty 2

## 2014-08-01 MED ORDER — CETYLPYRIDINIUM CHLORIDE 0.05 % MT LIQD
7.0000 mL | Freq: Four times a day (QID) | OROMUCOSAL | Status: DC
  Administered 2014-08-02 – 2014-08-05 (×14): 7 mL via OROMUCOSAL

## 2014-08-01 MED ORDER — FENTANYL CITRATE 0.05 MG/ML IJ SOLN
INTRAMUSCULAR | Status: AC
  Administered 2014-08-02: 100 ug via INTRAVENOUS
  Filled 2014-08-01: qty 2

## 2014-08-01 MED ORDER — SORBITOL 70 % SOLN
45.0000 mL | Freq: Once | Status: AC
  Administered 2014-08-01: 45 mL via ORAL
  Filled 2014-08-01: qty 60

## 2014-08-01 MED ORDER — SIMETHICONE 80 MG PO CHEW
80.0000 mg | CHEWABLE_TABLET | Freq: Three times a day (TID) | ORAL | Status: DC
  Administered 2014-08-01 (×3): 80 mg via ORAL
  Filled 2014-08-01 (×4): qty 1

## 2014-08-01 NOTE — Procedures (Addendum)
Central Venous Catheter Insertion Procedure Note Thomas BoatmanDennis R Andrews [id number] 09-17-1971  Procedure: Insertion of Central Venous Catheter Indications: Assessment of intravascular volume, Drug and/or fluid administration and Frequent blood sampling  Procedure Details Consent: Risks of procedure as well as the alternatives and risks of each were explained to the (patient/caregiver).  Consent for procedure obtained. Time Out: Verified patient identification, verified procedure, site/side was marked, verified correct patient position, special equipment/implants available, medications/allergies/relevent history reviewed, required imaging and test results available.  Performed  Maximum sterile technique was used including antiseptics, cap, gloves, gown, hand hygiene, mask and sheet. Skin prep: Chlorhexidine; local anesthetic administered A antimicrobial bonded/coated triple lumen catheter was placed in the left IJ vein using the Seldinger technique.  Evaluation Blood flow good Complications: No apparent complications Patient did tolerate procedure well. Chest X-ray ordered to verify placement.  CXR: pending.  Procedure performed under direct ultrasound guidance for real time vessel cannulation.   Thomas Guysahul Desai, PA - C [address] [facility] Critical [facility] Medicine Pgr: [phone]  or [phone]  Attending: I performed the procedure Heber CarolinaBrent McQuaid, MD [place] P[facility]M Pager: [phone] Cell: [phone] If no response, call [phone]

## 2014-08-01 NOTE — Procedures (Signed)
Intubation Procedure Note Sheralyn BoatmanDennis R Lichtman [id number] [birth year]  Procedure: Intubation Indications: Respiratory insufficiency  Procedure Details Consent: Risks of procedure as well as the alternatives and risks of each were explained to the (patient/caregiver).  Consent for procedure obtained. Time Out: Verified patient identification, verified procedure, site/side was marked, verified correct patient position, special equipment/implants available, medications/allergies/relevent history reviewed, required imaging and test results available.  Performed  Maximum sterile technique was used including cap, gloves, hand hygiene and mask.  MAC and 4  Medications used: 100mcg Fentanyl, 2mg  Versed, 20mg  Etomidate, 70mg  Rocuronium.  Evaluation Hemodynamic Status: BP stable throughout; O2 sats: stable throughout Patient's Current Condition: stable Complications: No apparent complications Patient did tolerate procedure well. Chest X-ray ordered to verify placement.  CXR: pending.  Glidescope used.   [name], PA - C [place] [facility] Medicine Pgr: [phone]  or [phone]   Attending: I was in the patient's room assisting with the procedure  Heber CarolinaBrent McQuaid, MD [place] P[facility]M Pager: [phone] Cell: [phone] If no response, call [phone]

## 2014-08-01 NOTE — Progress Notes (Signed)
Patient information reviewed and entered into eRehab system by Lorik Guo, RN, CRRN, PPS Coordinator.  Information including medical coding and functional independence measure will be reviewed and updated through discharge.    

## 2014-08-01 NOTE — H&P (Signed)
PULMONARY / CRITICAL CARE MEDICINE   Name: Thomas Andrews MRN: [id number] DOB: [birth year]    ADMISSION DATE:  08/01/2014 CONSULTATION DATE:  08/01/2014  REFERRING MD :  Riley Kill  CHIEF COMPLAINT:  Hemoptysis  INITIAL PRESENTATION: 43 y.o. M transferred to [facility] on [facility]/7 from [facility] where he had recent hospitalization which included intubation x 2 and HFOV x 3 days due to [facility].  On evening of 10/7, pt reportedly had 2 episodes of scant hemoptysis.  Later, he began to have respiratory distress requiring NRB.  PCCM was consulted and upon arrival at bedside, pt noted to be in moderate respiratory distress.  He was transferred to the ICU and started on BiPAP.  Unfortunately, SpO2 began to drop and pt's work of breathing began to increase.  He subsequently required intubation.  STUDIES:  CXR 10/7 >>>  SIGNIFICANT EVENTS: 10/7 - transferred to CIR from [facility]. 10/7 - PCCM consulted for hemoptysis, pt developed acute hypoxemic respiratory failure which failed BiPAP and required intubation. 10/7 - Bronched at bedside.   HISTORY OF PRESENT ILLNESS:  Pt is encephalopathic; therefore, this HPI is obtained from chart review. Thomas Andrews is a 43 y.o. M with PMH of SLE, HTN, CKD.  He recently was admitted at [facility] for SOB.  During hospitalization, pt developed acute respiratory failure secondary to [facility].  He was intubated twice (extubated 9/25) and had a 3 day course of HFOV, cyclophosphamide, rituximab and amicar.  In addition, he received 3 rounds of PLEX and 3 days of pulse dose steroids.  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  He was    PAST MEDICAL HISTORY :  Past Medical History  Diagnosis Date  . Systemic lupus erythematosus     with pulmonary, renal, cardiac, skin and joint involvement  . HTN (hypertension)   . CKD (chronic kidney disease)   . 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 Cholecystectomy     Prior to Admission medications   Medication Sig Start Date End Date Taking? Authorizing Provider  albuterol (PROVENTIL HFA;VENTOLIN HFA) 108 (90 BASE) MCG/ACT inhaler Inhale 2 puffs into the lungs every 6 (six) hours as needed for wheezing or shortness of breath.    Historical Provider, MD  amLODipine (NORVASC) 10 MG tablet Take 10 mg by mouth daily.    Historical Provider, MD  atovaquone (MEPRON) 750 MG/5ML suspension Take 1,500 mg by mouth daily.    Historical Provider, MD  calcium citrate-vitamin D (CITRACAL+D) 315-200 MG-UNIT per tablet Take 1 tablet by mouth daily.    Historical Provider, MD  carvedilol (COREG) 25 MG tablet Take 25 mg by mouth 2 (two) times daily with a meal.    Historical Provider, MD  doxazosin (CARDURA) 2 MG tablet Take 2 mg by mouth 2 (two) times daily.    Historical Provider, MD  hydrALAZINE (APRESOLINE) 50 MG tablet Take 50 mg by mouth every 8 (eight) hours.    Historical Provider, MD  hydroxychloroquine (PLAQUENIL) 200 MG tablet Take 400 mg by mouth daily.    Historical Provider, MD  levothyroxine (SYNTHROID, LEVOTHROID) 150 MCG tablet Take 150 mcg by mouth daily before breakfast.    Historical Provider, MD  Melatonin 3 MG CAPS Take 3 mg by mouth at bedtime.    Historical Provider, MD  pantoprazole (PROTONIX) 40 MG tablet Take 40 mg by mouth daily.  Historical Provider, MD  polyethylene glycol (MIRALAX / GLYCOLAX) packet Take 17 g by mouth daily as needed for moderate constipation.    Historical Provider, MD  predniSONE (DELTASONE) 10 MG tablet Take 50-80 mg by mouth daily with breakfast. Take 80 mg by mouth daily until 08/09/2014. Take 70 mg by mouth daily from 08/10/2014-08/23/2014. Take 60 mg by mouth daily from 08/24/2014-09/06/2014. Take 50 mg by mouth daily from 09/07/2014-09/20/2014. 07/31/14   Historical Provider, MD  sennosides-docusate sodium (SENOKOT-S) 8.6-50 MG tablet Take 2 tablets by mouth 2 (two) times daily.     Historical Provider, MD  voriconazole (VFEND) 200 MG tablet Take 200 mg by mouth 2 (two) times daily.    Historical Provider, MD   Allergies  Allergen Reactions  . Levaquin [Levofloxacin In D5w] Other (See Comments)    Rash and SOB    FAMILY HISTORY:  No family history on file. SOCIAL HISTORY:  has no tobacco, alcohol, and drug history on file.  REVIEW OF SYSTEMS:  Unable to obtain as pt is encephalopathic.  SUBJECTIVE:   VITAL SIGNS: Temp:  [97.9 F (36.6 C)-98.9 F (37.2 C)] 97.9 F (36.6 C) (10/07 2214) Pulse Rate:  [88-101] 93 (10/07 1959) Resp:  [16-20] 20 (10/07 1959) BP: (121-149)/(78-102) 149/102 mmHg (10/07 2214) SpO2:  [92 %-95 %] 93 % (10/07 1959) Weight:  [81.7 kg (180 lb 1.9 oz)] 81.7 kg (180 lb 1.9 oz) (10/07 0504) HEMODYNAMICS:   VENTILATOR SETTINGS:   INTAKE / OUTPUT: Intake/Output   None     PHYSICAL EXAMINATION: General: WDWN male, in moderate distress. Neuro: answers some questions appropriately, no focal deficits. HEENT: [place]/AT. PERRL, sclerae anicteric. Cardiovascular: RRR, no M/R/G.  Lungs: Respirations shallow and rapid.  Coarse crackles throughout. Abdomen: BS x 4, soft, NT/ND.  Musculoskeletal: No gross deformities, no edema.  Skin: Intact, warm, no rashes.  LABS:  CBC  Recent Labs Lab 08/01/14 0620  WBC 4.0  HGB 9.8*  HCT 29.5*  PLT 71*   Coag's No results found for this basename: APTT, INR,  in the last 168 hours BMET  Recent Labs Lab 08/01/14 0620  NA 138  K 4.9  CL 105  CO2 24  BUN 60*  CREATININE 1.91*  GLUCOSE 89   Electrolytes  Recent Labs Lab 08/01/14 0620  CALCIUM 6.3*   Sepsis Markers No results found for this basename: LATICACIDVEN, PROCALCITON, O2SATVEN,  in the last 168 hours ABG  Recent Labs Lab 08/01/14 2217  PHART 7.437  PCO2ART 29.7*  PO2ART 52.0*   Liver Enzymes  Recent Labs Lab 08/01/14 0620  AST 151*  ALT 165*  ALKPHOS 47  BILITOT 0.3  ALBUMIN 2.0*   Cardiac Enzymes No  results found for this basename: TROPONINI, PROBNP,  in the last 168 hours Glucose  Recent Labs Lab 07/31/14 1713 07/31/14 2114 08/01/14 0714 08/01/14 1155 08/01/14 1605 08/01/14 2110  GLUCAP 136* 159* 91 118* 161* 165*    Imaging Dg Chest Port 1 View  08/01/2014   CLINICAL DATA:  Respiratory distress. Coughing up blood. Initial encounter.  EXAM: PORTABLE CHEST - 1 VIEW  COMPARISON:  None.  FINDINGS: Extensive bilateral airspace disease is present. Heart size is enlarged. There is no pneumothorax or pleural effusion.  IMPRESSION: Extensive bilateral airspace disease identified and has appearance most compatible with pneumonia rather than edema.   Electronically Signed   By: Drusilla Kanner M.D.   On: 08/01/2014 22:50    ASSESSMENT / PLAN:  PULMONARY OETT 10/7 >>> A: Acute hypoxemic respiratory  failure Hemoptysis DAH - sequential BAL performed at bedside supportive of DAH (see image in bronch note). P:   Full mechanical support, wean as able. VAP bundle. DuoNebs / Albuterol. Pulse steroids - solumedrol 125mg  q6hrs x 3 days, then start taper. ABG and CXR in AM.  CARDIOVASCULAR A:  Hx HTN Hx myocarditis - secondary to SLE. P:  Hold outpatient norvasc, coreg, cardura, apresoline.  RENAL A:   CKD Baseline Cr when he left [facility] on 10/6 was 2 Lupus nephritis P:   NS @ 100. BMP now and in AM.  GASTROINTESTINAL A:   GI prophylaxis Nutrition P:   SUP: Pantoprazole. NPO. TF if remains NPO > 24 hours.  HEMATOLOGIC A:   Anemia of chronic disease +/- acute blood loss Thrombocytopenia VTE Prophylaxis P:  Transfuse per usual ICU guidelines. SCD's only. CBC now and in AM.  INFECTIOUS A:   Recent aspergillus in sputum culture from 10/1 PCP prophylaxis HCAP? P:   BCx2 10/7 >>> UCx 10/7 >>> Sputum Cx 10/7 >>> RVP 10/7 >>> PCP Stain 10/7 >>> Fungal Stain 10/7 >>> Aspergillus galactomannan Ag 10/7 >>> Abx: Mepron, start date 10/8, day 1/x. Abx:  Voriconazole, start date 10/8, day 1/x. Abx: Vanc start date 10/8, day 1/x Abx: Imipenem start date 10/8 day 1/x  ENDOCRINE A:   At risk relative A.I. - has been on slow high dose prednisone taper. P:   Solumedrol. CBG's q4hr. SSI.  NEUROLOGIC A:   Acute metabolic encephalopathy P:   Sedation:  Propofol gtt / Fentanyl gtt. May need paralysis for vent dysynchrony. RASS goal: 0 to -1. Daily WUA.  RHEUMATOLOGIC A: SLE P: Continue plaquenil. Was previously on prednisone taper - change to Solumedrol pulse dose now. Per CIR note, next dose Rituxan 1000mg  on Friday 10/9, and next dose Cytoxan 450mg  on Saturday 10/17. Check dS DNA, C3 and C4 complement. Consider rheumatology consult in AM.  TODAY'S SUMMARY: 43 y.o. M with SLE, just transferred to CIR on 10/7 from [facility] where he was ventilated for [facility].  Developed respiratory distress + scant hemoptysis while on CIR evening of 10/7.  Required transfer to ICU and intubation.  Bedside bronch done, sequential BAL suggestive of DAH.  Follow cultures.   Rutherford Guys, PA - C [place] [facility] Pgr: [phone]  or [phone] 08/01/2014, 11:09 PM  Attending:  I have seen and examined the patient with nurse practitioner/resident and agree with the note above.   Required emergent intubation this evening for acute hypoxemic respiratory failure. Chart reviewed with records available, can see imaging reports and labs from [facility] through [facility] but not progress notes for some reason.  Recent refractory lupus pneumonitis and nephritis treated with cytoxan, rituxan, and PLEX at [facility] requiring mechanical ventilation x2.    Tonight has evidence of DAH on BAL, but this is a syndrome with many possible etiologies.  I suspect he is having another lupus flare, but want to see the results of BAL gram stain, PCP and viral pcr before pulling the trigger on more cytoxan out of his previous schedule.  In the meantime  will plan to treat with broad spectrum antibiotics for HCAP (which I doubt), look for renal failure, and continue solumedrol at 500mg  daily.  If no evidence of infection would either transfer to [facility] by 10/9 or start PLEX and restart more cytoxan here.  Not sure what to make of aspergillus in BAL from [facility], will send galactomannan and repeat fungal  culture while maintaining voriconazole.  Family updated at bedside.  I have personally obtained a history, examined the patient, evaluated laboratory and imaging results, formulated the assessment and plan and placed orders. CRITICAL CARE: The patient is critically ill with multiple organ systems failure and requires high complexity decision making for assessment and support, frequent evaluation and titration of therapies, application of advanced monitoring technologies and extensive interpretation of multiple databases. Critical Care Time devoted to patient care services described in this note is 90 minutes.    Heber CarolinaBrent McQuaid, MD [place] PCCM Pager: [phone] Cell: [phone] If no response, call [phone]

## 2014-08-01 NOTE — Progress Notes (Signed)
[facility] Admission Coordinator Signed Physical Medicine and Rehabilitation PMR Pre-admission Service date: 07/26/2014 3:27 PM  Related encounter: Documentation from 07/26/2014 in [place] PMR Admission Coordinator Pre-Admission Assessment   Patient: Thomas Andrews is an 43 y.o., male MRN: [id number] DOB: Feb 03, 1971 Height: 5\' 10"  (177.8 cm) Weight: 81.647 kg (180 lb)   Insurance Information HMO:     PPO: yes     PCP:      IPA:      80/20:      OTHER:   PRIMARY: [place]      Policy#: [id number]      Subscriber: self CM Name: Phoebe Sharps RN      Phone#: [phone], ext. [phone]     Fax#: [phone] Authorization given for seven days on 07-26-14 from Deb/Paula. However, pt had further medical issues and was admitted to inpatient rehab on 07-31-14. I then spoke with Joelene Millin, RN on 07-31-14 to share updates on pt status and authorization details. Authorization confirmed with Nevin Bloodgood via Joelene Millin and now approval is from 07-31-14 through 08-07-14 with updates due to [place] on 08-07-14 at above fax. Pre-Cert#: [id number]      Employer: [employer] Benefits:  Phone #: [phone]     Name: Bo Eff. Date: 10-26-13     Deduct: $750 (met all)      Out of Pocket Max: [phone] (met $717.42)      Life Max: unlimited CIR: 80/20%; pre-auth needed      SNF: 80/20%, pre-auth needed Outpatient: 80%     Co-Pay: 20%, 70 visits total for both PT/OT and 20 visits total for SLP Home Health: 80%      Co-Pay: 20%, 100 visit limit, pre-auth needed DME: 80%     Co-Pay: 20% Providers: in network  Emergency Contact Information Contact Information     Name  Relation  Home  Work  [place]  Mother  [phone]    [phone]         Current Medical History  Patient Admitting Diagnosis: Deconditioning due to acute hypoxic respiratory failure secondary to diffuse alveolar hemorrhage History of Present Illness: This 43 year  old male with a history of Lupus, CKD with unknown baseline creatinine and cardiomyopathy presented to [facility] ED on 07-07-14 with shortness of breath and cough for a couple of weeks and scant hemoptysis. Pt had also noted swelling to both lower legs extending to the thigh. Pt then had acute hypoxic hypercarbic respiratory failure secondary to diffuse alveolar hemorrhage. Pt was intubated twice (last extubated 07-20-14) and had a 3 day course of [place], cyclophosphamide, rituximab and amicar. Pt had no further episodes of hemoptysis since 07-18-14. 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 Pt needed dialysis and last hemodialysis treatment was on 07-24-14 through [facility]. Creatinine has been stable. Pt has chronic HTN that was exacerbated by ARF and SLE flare as well as volume overload. Pt received 3 rounds of PLEX (plasma exchange) and 3 days of pulse dose steroids. On 07-27-14, pt had fever and inpatient rehab admission was delayed. Then, patient needed two units PRBCs and Hgb is now stable.  Pt has been progressing well with therapies and inpatient rehab was recommended.    Patient's medical record from [facility] has been reviewed by the rehabilitation admission coordinator and physician.   Past Medical History  No past medical history  on file.   Family History  family history is not on file.   Prior Rehab/Hospitalizations: none               Current Medications  see MAR   Patients Current Diet:  Regular diet   Precautions / Restrictions Precautions Precautions: Fall Restrictions Weight Bearing Restrictions: No    Prior Activity Level Community (5-7x/wk): Pt was independent prior to getting sick, working full times as a Charity fundraiser in [place]. He is recently divorced and sees his 51 yo daughter several days a week. 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   Home Assistive Devices / Equipment Home Assistive Devices/Equipment: None      Prior Functional Level  Current Functional Level   Bed Mobility   Independent   Min assist (HOB elevated, used bed rails and increased time)    Transfers   Independent   Mod assist (Moderate assist sit to stand, controls descent with UE's, pr)    Mobility - Walk/Wheelchair   Independent   Mod assist (Moderate assist 120' with [place] walker) Improved to minimal assist with rolling walker   Upper Body Dressing   Independent   Other (overall ADL status from Mod assist to set up)    Lower Body Dressing   Independent   Min assist (Min assist for threading foot through pant hole, cannot stand)    Grooming   Independent   Other (set up assistance)    Eating/Drinking   Independent   Other (set up assistance)    Toilet Transfer   Independent   Mod assist (Moderate assist to [facility])    Bladder Continence     WFL   continent, using urinal    Bowel Management   WFL   continent, last BM on 07-30-14    Stair Climbing   Independent   Other (not assessed, anticipate needs)    Communication   [facility]   [facility]    Memory   [facility]   WFL    Cooking/Meal Prep   Independent        Housework   Independent      Money Management   Independent      Driving   Independent         Special needs/care consideration BiPAP/CPAP no CPM no   Continuous Drip IV no   Dialysis - no (pt's last dialysis was on 07-24-14)          Life Vest no   Oxygen no   Special Bed no   Trach Size no   Wound Vac (area) no        Skin - no issues                                Bowel mgmt: last BM on 07-30-14 Bladder mgmt: currently using urinal Diabetic mgmt no   Previous Home Environment Living Arrangements: Alone  Lives With: Alone Available Help at Discharge: Family;Available 24 hours/day Type of Home: Apartment (3rd floor apartment, plans to be moving out soon) Home Layout: One level Home  Access: Stairs to enter Entrance Stairs-Rails: Left Entrance Stairs-Number of Steps: 3 flights to 3rd floor Additional Comments: Pt states he was able to break his apartment lease due to medical issues (lives on 3rd floor apartment), plans to stay with Mom until recovered and  then get a new place.   Discharge Living Setting Plans for Discharge Living Setting: Other (Comment) (plan is to go to mother's house) Type of Home at Discharge: House Discharge Home Layout: One level Discharge Home Access: Stairs to enter Entrance Stairs-Rails: None Entrance Stairs-Number of Steps: 1 Does the patient have any problems obtaining your medications?: No   Social/Family/Support Systems Patient Roles: Parent;Other (Comment) (works full time as Charity fundraiser and runs his own trucking business on the side with a friend) Sport and exercise psychologist Information: mother Dyann Kief is primary contact Anticipated Caregiver: mother Anticipated Ambulance person Information: see above Ability/Limitations of Caregiver: no limitations Caregiver Availability: 24/7 Discharge Plan Discussed with Primary Caregiver: Yes (discussed with Mom and pt on 07-26-14, 07-27-14, 07-30-14 and 07-31-14) Is Caregiver In Agreement with Plan?: Yes Does Caregiver/Family have Issues with Lodging/Transportation while Pt is in Rehab?: No   Goals/Additional Needs Patient/Family Goal for Rehab: Supervision and Mod Ind with PT/OT; NA for SLP Expected length of stay: 10-14 days Cultural Considerations: none Dietary Needs: regular diet Equipment Needs: to be determined Pt/Family Agrees to Admission and willing to participate: Yes Program Orientation Provided & Reviewed with Pt/Caregiver Including Roles  & Responsibilities: Yes   Patient Condition: I met with pt and his mother at [facility] on 07-26-14 to explain the possibility of acute inpatient rehab. This 43 year old patient was previously independent in all aspects of life, working  full time as a Charity fundraiser and also running a side trucking business. He has a history of lupus and has had an involved medical course involving respiratory/renal issues since being admitted to [facility] on 07-07-14. Pt is currently needing minimal help with bed mobility and moderate assistance to stand and ambulate with a [place] walker. He is demonstrating proximal muscle weakness in his legs making the sit to stand transition difficult. He is also needing minimal to moderate help with self care tasks due to generalized weakness. Patient will benefit greatly from the multi-disciplinary team of skilled PT, OT, SLP and rehab nursing to maximize his functional return following this involved medical course of respiratory failure and renal issues. PT, OT and rehab nursing will focus on increasing strength for greater independence with bed mobility, transfers, gait and self care tasks. In addition, rehab nursing can educate pt/family on medication needs and any issues with bowel/bladder training. Pt will benefit from rehab physician intervention to monitor his renal function and overall medical status in light of lupus. Discussed case with Dr. Naaman Plummer who feels that pt is a good inpatient rehab candidate. Pt and his mother are motivated to come to inpatient rehab and will benefit from the intensive services of skilled therapy under rehab physician guidance. Pt will be admitted today on     Preadmission Screen Completed By:  Nanetta Batty, PT, 07/31/2014 1111am ______________________________________________________________________    Discussed status with Dr. Naaman Plummer on 07-31-14 at 1111 and received telephone approval for admission today.   Admission Coordinator:  Nanetta Batty, PT, time 1111/Date 07-31-14    Assessment/Plan: Diagnosis: debilitation after respiratory failure and multiple other medical problems Does the need for close, 24 hr/day  Medical supervision in concert with the patient's rehab  needs make it unreasonable for this patient to be served in a less intensive setting? Yes Co-Morbidities requiring supervision/potential complications: ckd, lups, CM Due to bladder management, bowel management, safety, skin/wound care, disease management, medication administration, pain management and patient education, does the patient require 24 hr/day rehab nursing? Yes Does the patient require coordinated care of  a physician, rehab nurse, PT (1-2 hrs/day, 5 days/week) and OT (1-2 hrs/day, 5 days/week) to address physical and functional deficits in the context of the above medical diagnosis(es)? Yes Addressing deficits in the following areas: balance, endurance, locomotion, strength, transferring, bowel/bladder control, bathing, dressing, feeding, grooming, toileting and psychosocial support Can the patient actively participate in an intensive therapy program of at least 3 hrs of therapy 5 days a week? Yes The potential for patient to make measurable gains while on inpatient rehab is excellent Anticipated functional outcomes upon discharge from inpatients are: modified independent PT, modified independent OT, n/a SLP Estimated rehab length of stay to reach the above functional goals is: 10-14 days Does the patient have adequate social supports to accommodate these discharge functional goals? Yes Anticipated D/C setting: Home Anticipated post D/C treatments: HH therapy and Outpatient therapy Overall Rehab/Functional Prognosis: excellent       RECOMMENDATIONS: This patient's condition is appropriate for continued rehabilitative care in the following setting: CIR Patient has agreed to participate in recommended program. Yes Note that insurance prior authorization may be required for reimbursement for recommended care.   Comment: Admit to inpatient rehab today   Meredith Staggers, MD, [facility] 07/31/2014     Nanetta Batty,  PT 07/31/2014  Revision History...     Date/Time User Action   07/31/2014 11:33 AM Meredith Staggers, MD Sign   07/31/2014 11:12 AM [place] [facility]  View Details Report

## 2014-08-01 NOTE — Progress Notes (Signed)
Called to patients room by nurse for increased shortness of breath. Patient on neb tx with increased respiratory rate,useing accessory muscles and diaphoretic. Patient placed on 100% NRB and arterial blood gas was drawn. Results reported to rapid response nurse

## 2014-08-01 NOTE — Progress Notes (Addendum)
[facility] and recheck today is 6.3. On oral supplements and with low albumin corrects to 7.66. Ionized calcium and Vit D levels pending.

## 2014-08-01 NOTE — Progress Notes (Signed)
Switched from NRB and Initiated BiPAP 100%.

## 2014-08-01 NOTE — Evaluation (Signed)
Physical Therapy Assessment and Plan  Patient Details  Name: Thomas Andrews MRN: [id number] Date of Birth: 18-Aug-1971  PT Diagnosis: Abnormal posture, Abnormality of gait, Coordination disorder, Difficulty walking, Edema and Muscle weakness Rehab Potential: Good ELOS: 14 to 17 days   Today's Date: 08/01/2014 PT Individual Time: 0830-0930 Tx 2: 1300-1400 PT Individual Time Calculation (min): 60 min  Tx 2: 60 minutes  Problem List:  Patient Active Problem List   Diagnosis Date Noted  . 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 Acute blood loss anemia 08/01/2014  . Thrombocytopenia 08/01/2014  . Hypocalcemia 08/01/2014  . Pulmonary aspergillosis 08/01/2014  . Acute on chronic renal failure 08/01/2014  . Debility 07/31/2014    Past Medical History:  Past Medical History  Diagnosis Date  . Systemic lupus erythematosus     with pulmonary, renal, cardiac, skin and joint involvement  . HTN (hypertension)   . CKD (chronic kidney disease)   . 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 Cholecystectomy      Assessment & Plan Clinical Impression:  MR. Avish Torry is a 43 year old male with history of SLE with multisystem involvement who was admitted to [facility] on 07/07/14 with several weeks of SOB, BLE edema progressive to blood tinged productive cough, fever and chills. Patient reported to be on recent steroid taper by his rheumatologist (Dr. Drue Novel). He was started on IV antibiotics for patchy airspace disease and IV diuretics for edema. CT chest showed multifocal areas of consolidation and requires intubation with high frequency oscillation for respiratory failure. Bronchoscopy showed at bedside revealed diffuse blood staining the airways and he was started on high dose steroids for diffuse alveolar hemorrhage. Rheumatology was consulted for input and home azathioprine was  discontinued and patient was treated with cytoxan 09/17 with plans to repeat every 4 weeks. 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 He was treated with high doses of prednisone with recommendations for slow taper. He underwent PLEX 9/15-9/18 and tolerated extubation on 09/19 and IV antibiotics were discontinued due to low suspicion of infection.  On 09/23, he developed acute worsening of DAH requiring re-intubation and 3 round of PLEX as well as Rituximab on 09/26 with recommendations for second dose in 2 weeks. Acute on chronic renal failure treated with CVVHD to iHD with improvement in renal status and UOP. Dialysis was discontinued by end of September with reports of good urine output and recommendations to utilize lasix prn for any volume issues. Renal biopsy was done on 9/30. 2D echo done revealing EF 50% with mild LVH and mild RV dysfunction. He started having fevers on 10/01 and cultures of bronch specimen grew out aspergillus on 10/02 and patient also had candida in urine as well as oral thrush. Although though to be a contaminant due to immuno suppresion ID/Dr. Payton Emerald recommended treating patient with Voriconazole 21m bid for at least 12 weeks with weekly monitoring of trough levels.  Blood pressures have been managed on oral medications and nicardipine drip weaned off. Recommendation for Rituximab 1000 mg on 10/09 and [facility]. Plans for slow prednisone taper by 10 mg ever two weeks--next decrease to 70 mg/day on 10/14. He is to continue on PJP prophylaxis with bactrim DS MWF, plaquenil 400 mg/day  and CCB for Raynaud's. Swallow evaluation done 09/30 showing no evidence of dysphagia and patient tolerating regular diet with aspiration precautions. Had pancytopenia with platelets down to 45, ANC 2.2 as well as drop in H/H to 6.7 on 10/03 and  transfused with 2 units PRBC. Pancytopenia expected as SE of cytoxan and to transfuse platelets if <10. Hypocalcemia treated with calcium supplements.  Patient transferred to CIR on 07/31/2014 .   Patient currently requires max with mobility secondary to muscle weakness, decreased cardiorespiratoy endurance and decreased muscular endurance, impaired timing and sequencing, unbalanced muscle activation and decreased coordination and decreased sitting balance, decreased standing balance, decreased postural control and decreased balance strategies.  Prior to hospitalization, patient was independent  with mobility and lived with Alone in a House (mom's home) home.  Home access is one step to enter mother's homeStairs to enter.  Patient will benefit from skilled PT intervention to maximize safe functional mobility, minimize fall risk and decrease caregiver burden for planned discharge home with 24 hour assist.  Anticipate patient will benefit from follow up [facility] at discharge.  PT - End of Session Activity Tolerance: Tolerates < 10 min activity with changes in vital signs Endurance Deficit: Yes Endurance Deficit Description: cardiorespiratory limitations and muscular endurance limitations from fatigue PT Assessment Rehab Potential: Good Barriers to Discharge: Inaccessible home environment (1 STE) PT Patient demonstrates impairments in the following area(s): Balance;Edema;Endurance;Motor;Safety PT Transfers Functional Problem(s): Bed Mobility;Bed to Chair;Car;Furniture PT Locomotion Functional Problem(s): Ambulation;Wheelchair Mobility;Stairs PT Plan PT Intensity: Minimum of 1-2 x/day ,45 to 90 minutes PT Frequency: 5 out of 7 days PT Duration Estimated Length of Stay: 14 to 17 days PT Treatment/Interventions: Ambulation/gait training;Discharge planning;Functional mobility training;Psychosocial support;Therapeutic Activities;Wheelchair propulsion/positioning;Therapeutic Exercise;Neuromuscular  re-education;Disease management/prevention;Balance/vestibular training;Cognitive remediation/compensation;DME/adaptive equipment instruction;Splinting/orthotics;UE/LE Strength taining/ROM;UE/LE Coordination activities;Stair training;Patient/family education;Functional electrical stimulation;Community reintegration PT Transfers Anticipated Outcome(s): Mod I PT Locomotion Anticipated Outcome(s): Mod I PT Recommendation Recommendations for Other Services:  (none) Follow Up Recommendations: Home health PT Patient destination: Home (mother's home) Equipment Recommended: To be determined;Wheelchair (measurements);Wheelchair cushion (measurements);Rolling walker with 5" wheels Equipment Details: tbd  Skilled Therapeutic Intervention Treatment Session 1: PT Evaluation: PT instructs pt in functional mobility assessment and finds pt to have good B UE strength, but poor to fair B LE strength. Pt has the most difficulty with sit to stand, but once up is able to walk a few feet.   Gait Training: PT instructs pt in ambulation with RW x 3' req min A for weight shifting and close w/c follow due to pt's low endurance.   Therapeutic Activity: PT instructs pt in bed mobility req Supervision to roll R and L in bed, min A for L side lie to sit transfer, min-mod A scoot transfer bed to w/c.  PT instructs pt in sit to stand from w/c with RW req max A.   Treatment Session 2: Therapeutic Activity: PT instructs pt in supine to sit transfer via long sit req mod A for B LEs off the edge of the bed. Pt is unable to sit to stand from typical bed height, so PT raises height of bed and raises bedrail so pt is able to push off of bed req mod A sit to stand with RW and then min A stand-step transfer with RW from bed to w/c.  PT instructs pt in chair push-ups with w/c armrests x 10 reps req mod A for bottom clearance.  PT instructs pt in scoot/squat-pivot transfer w/c to bed to [facility] req min-mod A for bottom  clearance. PT  instructs pt in rocking side to side and PT doffs pt's shorts. PT placed pillow behind pt's back for comfort, placed small stool under feet to assist in positioning the rectum, and instructed pt in colonic massage - all to assist with bowel movement. Pt's mom entered at end of treatment session and stayed with pt - call light in reach, as well.   Therapeutic Exercise: PT instructs pt in seated LE strengthening and ROM exercises :LAQ, march, hip adduction/IR and abduction/ER - all x 10 reps.   W/C Management: PT fit pt's legrests to provide appropriate pressure relief when sitting up in w/c. PT instructs pt in w/c accessory management including flipping armrests back, swinging away legrests, doffing legrests, and donning/doffing brakes - pt req demonstration and then is able to demonstrate ability to do it.   Pt is very fatigued by 2nd PT session, today, and req frequent rest breaks. Pt will benefit from PT to maximize functional independence, with focus on safe sit to stand and safe energy conservation/pacing. Continue per PT POC.     PT Evaluation Precautions/Restrictions Precautions Precautions: Fall Restrictions Weight Bearing Restrictions: No General Chart Reviewed: Yes Family/Caregiver Present: No Vital SignsTherapy Vitals Temp: 98.6 F (37 C) Temp Source: Oral Pulse Rate: 101 BP: 129/83 mmHg Oxygen Therapy SpO2: 92 % O2 Device: None (Room air) Pain Pain Assessment Pain Assessment: No/denies pain Tx 2: Pt denies pain.  Home Living/Prior Functioning Home Living Available Help at Discharge: Family;Available 24 hours/day Type of Home: House (mom's home) Home Access: Stairs to enter [employer] of Steps: one step to enter mother's home Home Layout: One level Additional Comments: above information reflects his mother's place (d/c destination). After d/c, he will be looking for an apartment - PT recommended an accessible one.   Lives With: Alone Prior  Function Level of Independence: Independent with basic ADLs;Independent with homemaking with ambulation;Independent with gait;Independent with transfers  Able to Take Stairs?: Yes Driving: Yes Vocation: Full time employment Vision/Perception     Cognition Overall Cognitive Status: Within Functional Limits for tasks assessed Orientation Level: Oriented X4 Attention: Focused;Sustained Focused Attention: Appears intact Sustained Attention: Appears intact Memory: Appears intact Awareness: Appears intact Safety/Judgment: Appears intact Sensation Sensation Light Touch: Appears Intact Stereognosis: Not tested Hot/Cold: Not tested Proprioception: Appears Intact Coordination Gross Motor Movements are Fluid and Coordinated: Yes Fine Motor Movements are Fluid and Coordinated: Not tested Finger Nose Finger Test: wfl bilaterally Heel Shin Test: impaired with L LE due to hip flexor weakness, but smooth movement and accuracy Motor  Motor Motor: Abnormal postural alignment and control Motor - Skilled Clinical Observations: slouched sitting posture - generalized weakness in spinal extensors  Mobility Bed Mobility Bed Mobility: Rolling Right;Rolling Left;Left Sidelying to Sit Rolling Right: 5: Supervision Rolling Right Details: Verbal cues for technique Rolling Right Details (indicate cue type and reason): grab the edge of the bed Rolling Left: 5: Supervision Rolling Left Details: Verbal cues for technique Rolling Left Details (indicate cue type and reason): grab the edge of the bed Left Sidelying to Sit: 4: Min assist Left Sidelying to Sit Details: Manual facilitation for placement;Verbal cues for technique Left Sidelying to Sit Details (indicate cue type and reason): hand placement Transfers Transfers: Yes Sit to Stand: 2: Max assist Sit to Stand Details: Manual facilitation for placement Sit to Stand Details (indicate cue type and reason): hand placement Lateral/Scoot Transfers: 4:  Min assist Lateral/Scoot Transfer Details: Manual facilitation for placement;Verbal cues for sequencing;Verbal cues for technique Lateral/Scoot Transfer  Details (indicate cue type and reason): hand placement, do small scoots Locomotion  Ambulation Ambulation: Yes Ambulation/Gait Assistance: 4: Min assist Ambulation Distance (Feet): 3 Feet Assistive device: Rolling walker Ambulation/Gait Assistance Details: Manual facilitation for weight shifting;Verbal cues for precautions/safety Gait Gait: Yes Gait Pattern: Impaired Gait Pattern: Shuffle;Decreased trunk rotation;Wide base of support;Decreased hip/knee flexion - left;Decreased hip/knee flexion - right;Decreased dorsiflexion - right;Decreased dorsiflexion - left;Decreased stride length Gait velocity: slowed and apprehensive Stairs / Additional Locomotion Stairs: No Wheelchair Mobility Wheelchair Mobility: Yes Wheelchair Assistance: 5: Careers information officer: Both upper extremities Wheelchair Parts Management: Needs assistance Distance: 50  Trunk/Postural Assessment  Cervical Assessment Cervical Assessment: Within Functional Limits Thoracic Assessment Thoracic Assessment: Within Functional Limits Lumbar Assessment Lumbar Assessment: Within Functional Limits Postural Control Postural Control: Deficits on evaluation Head Control: weak neck extensors and limited muscular endurance Trunk Control: weak spinal extensors and limited muscular endurance Postural Limitations: slouched sitting posture with head down and slightly forward edge of bed without back support  Balance Balance Balance Assessed: Yes Static Sitting Balance Static Sitting - Balance Support: Bilateral upper extremity supported;Feet supported Static Sitting - Level of Assistance: 5: Stand by assistance Dynamic Sitting Balance Dynamic Sitting - Balance Support: Bilateral upper extremity supported;Feet supported;During functional activity Dynamic Sitting -  Level of Assistance: 4: Min assist Dynamic Sitting - Balance Activities: Forward lean/weight shifting Static Standing Balance Static Standing - Balance Support: Bilateral upper extremity supported Static Standing - Level of Assistance: 4: Min assist Dynamic Standing Balance Dynamic Standing - Balance Support: Bilateral upper extremity supported Dynamic Standing - Level of Assistance: 3: Mod assist Dynamic Standing - Balance Activities: Forward lean/weight shifting Extremity Assessment  RUE Assessment RUE Assessment: Exceptions to [facility] RUE Strength RUE Overall Strength: Deficits RUE Overall Strength Comments: grossly 4/5 LUE Assessment LUE Assessment: Exceptions to [facility] LUE Strength LUE Overall Strength: Deficits LUE Overall Strength Comments: grossly 4/5 RLE Assessment RLE Assessment: Exceptions to [facility] RLE Strength RLE Overall Strength: Deficits RLE Overall Strength Comments: hip flexion 3-/5, hip abduction 2+/5, knee extension 3-/5, ankle DF 4/5 LLE Assessment LLE Assessment: Exceptions to [facility] LLE Strength LLE Overall Strength: Deficits LLE Overall Strength Comments: hip flexion 2+/5, knee extension 3/5, knee flexion 4/5, ankle DF 4/5  FIM:   See FIM for current status   Refer to Care Plan for Long Term Goals  Recommendations for other services: None  Discharge Criteria: Patient will be discharged from PT if patient refuses treatment 3 consecutive times without medical reason, if treatment goals not met, if there is a change in medical status, if patient makes no progress towards goals or if patient is discharged from hospital.  The above assessment, treatment plan, treatment alternatives and goals were discussed and mutually agreed upon: by patient  [facility] M 08/01/2014, 10:56 AM

## 2014-08-01 NOTE — Significant Event (Signed)
Rapid Response Event Note Called per floor RN for pt in respiratory distress, coughing up small amount bright red blood tonight and per floor RN report on 7a shift as well. [facility] following extended stay for SLE flare, alveolar hemorrhage and aspergillus lung infection requiring intubation X 2 and oscillator at some point.   Overview: Time Called: 2215 Arrival Time: 2220 Event Type: Respiratory  Initial Focused Assessment: Pt found resting in bed. Severe respiratory distress, diaphoretic, very warm skin. Unable to complete sentences, but nods yes and no to questions. Unable to obtain true po2 sat at bedside, pt on NRB rr 35-40. Lungs congested bilateral crackles worse on left side.  Interventions: ABG obtained per RT. PA Pam Love called and updated on pt status. Agreed to transfer to ICU and consult PCCM. Call received from Dr.Yacoub [facility](PCCN), updated on pt status and read back ABG result. P.A [name] to bedside for PCCM to assess and admit pt. Portable CXR obtained on 4 west. Pt's Mother called and updated on pt change in status, en route [facility]. [facility] at 2250 for bipap and closer monitoring. ICU RN updated on rapid response interventions.  Event Summary: Name of Physician Notified: PA Marissa NestlePam Love at 2220  Name of Consulting Physician Notified: PCCM Molli Knock-Yacoub MD at 2235  Outcome: Transferred (Comment)  Event End Time: 2300  Chauncey ReadingWhite, Lemon Sternberg Leigh Anhad Sheeley

## 2014-08-01 NOTE — Progress Notes (Signed)
*  Preliminary Results* Bilateral lower extremity venous duplex completed. Bilateral lower extremities are negative for deep vein thrombosis. There is no evidence of Baker's cyst bilaterally.  08/01/2014  Gertie FeyMichelle Kerra Guilfoil, RVT, RDCS, RDMS

## 2014-08-01 NOTE — Progress Notes (Signed)
[place] PHYSICAL MEDICINE & REHABILITATION     PROGRESS NOTE    Subjective/Complaints: No complaints other than some difficulty sleeping. "a lot of activity on unit last night" A  review of systems has been performed and if not noted above is otherwise negative.   Objective: Vital Signs: Blood pressure 123/82, pulse 91, temperature 98.9 F (37.2 C), temperature source Oral, resp. rate 17, weight 81.7 kg (180 lb 1.9 oz), SpO2 94.00%. No results found. No results found for this basename: WBC, HGB, HCT, PLT,  in the last 72 hours No results found for this basename: NA, K, CL, CO, GLUCOSE, BUN, CREATININE, CALCIUM,  in the last 72 hours CBG (last 3)   Recent Labs  07/31/14 1713 07/31/14 2114  GLUCAP 136* 159*    Wt Readings from Last 3 Encounters:  08/01/14 81.7 kg (180 lb 1.9 oz)  07/26/14 81.647 kg (180 lb)    Physical Exam:  Constitutional: He is oriented to person, place, and time. He appears well-developed and well-nourished.  HENT:  Head: Normocephalic and atraumatic.  Eyes: Conjunctivae and EOM are normal. Pupils are equal, round, and reactive to light.  Neck: Normal range of motion. Neck supple.  Cardiovascular: Regular rhythm. Tachycardic .  Respiratory: Effort normal. No respiratory distress. He has decreased breath sounds in the right lower field and the left lower field. He has no wheezes. He has no rhonchi.  GI: Soft. Bowel sounds are normal. He exhibits no distension. There is no tenderness.  Musculoskeletal: He exhibits edema (1+ pedal edema. Trace to 1+ pitting edema from knees to ankles. ).  Neurological: He is alert and oriented to person, place, and time. RUE: deltoid 5/5, bicep 5/5, tricep 5/5, wrist ext 5/5, hand intrinsics 5/5  LUE: deltoid 5/5, bicep 5/5, triceps 5/5, wrist ext 5/5, hand instrincs 5/5  RLE: HF 2/5, KE 4-/5, KF 3+/5, ADF 4/5, APF 4/5  LLE: HF 2/5, KE 4-/5, HF, 3+/5, ADF 4/5, APF 4/5  Skin: Skin is warm and dry.  No skin breakdown  noted.    Psychiatric: He has a normal mood and affect. His behavior is normal. Judgment and thought content normal   Assessment/Plan: 1. Functional deficits secondary to debilitation due to SLE flare and DAH,lupus nephritis, aspergillus lung infection which require 3+ hours per day of interdisciplinary therapy in a comprehensive inpatient rehab setting. Physiatrist is providing close team supervision and 24 hour management of active medical problems listed below. Physiatrist and rehab team continue to assess barriers to discharge/monitor patient progress toward functional and medical goals. FIM:                      Expression Expression Mode: Verbal Expression: 7-Expresses complex ideas: With no assist  Social Interaction Social Interaction: 7-Interacts appropriately with others - No medications needed.  Problem Solving Problem Solving: 6-Solves complex problems: With extra time  Memory Memory: 7-Complete Independence: No helper  Medical Problem List and Plan:  1. Functional deficits secondary to Debilitation due to SLE flare with DAH, lupus nephritis and aspergillus lung infection.  2. DVT Prophylaxis/Anticoagulation: Mechanical: Antiembolism stockings, thigh (TED hose) Bilateral lower extremities  3. Pain Management: No complaints of pain reported at present 4. Mood: LCSW to follow for evaluation and support.  5. Neuropsych: This patient is capable of making decisions on her own behalf.  6. Skin/Wound Care: Routine pressure relief measures. Monitor for any signs of breakdown.  7. Fluids/Electrolytes/Nutrition: Monitor I/O. Add nutritional supplements due to signs of severe  malnutrition.  8. Aspergillus lung infection: Continue Voriconozole bid for 3 months with next next trough on 10/09-pending CBC/CMET today and then weekly check of trough and CMET starting 10/16.  9. CKD: Monitor UOP as well as recovery of renal status. Treat BLE edema with diuretics as indicated.   10. HTN: Will monitor every 8 hours. Continue Norvasc, Cardura, Hydralazine and Coreg.  11. SLE: Will continue plaquenil daily with mepron for PCP. Prednisone 80 mg till 10/15 then 70 mg X 2 weeks, etc for slow taper. Next dose of Rituxan on Friday and Ok to administer at [facility]. Next dose cytoxan on 10/15  12. Pancytopenia: Will monitor with routine check.  Serial cbc's  13. Urinary retention: checking  PVRs. Cath for volumes > 350 cc.   LOS (Days) 1 A FACE TO FACE EVALUATION WAS PERFORMED  Thomas Andrews T 08/01/2014 7:33 AM

## 2014-08-01 NOTE — Discharge Instructions (Signed)
Inpatient Rehab Discharge Instructions  Thomas NickelDennis R Andrews Discharge date and time:    Activities/Precautions/ Functional Status: Activity: activity as tolerated Diet: regular diet Wound Care: none needed Functional status:  ___ No restrictions     ___ Walk up steps independently ___ 24/7 supervision/assistance   ___ Walk up steps with assistance ___ Intermittent supervision/assistance  ___ Bathe/dress independently ___ Walk with walker     ___ Bathe/dress with assistance ___ Walk Independently    ___ Shower independently ___ Walk with assistance    ___ Shower with assistance ___ No alcohol     ___ Return to work/school ________  Special Instructions:    My questions have been answered and I understand these instructions. I will adhere to these goals and the provided educational materials after my discharge from the hospital.  Patient/Caregiver Signature _______________________________ Date __________  Clinician Signature _______________________________________ Date __________  Please bring this form and your medication list with you to all your follow-up doctor's appointments.

## 2014-08-01 NOTE — Progress Notes (Signed)
CRITICAL VALUE ALERT  Critical value received:  Calcium 6.3  Date of notification:  08-01-14  Time of notification:  0733  Critical value read back:Yes.    Nurse who received alert:  Raynelle JanAngela Seira Cody,RN  MD notified (1st page):  Merideth Abbeyan Anguill, PA  Time of first page:  [phone]  MD notified (2nd page):  Time of second page:  Responding MD:  Purvis Sheffieldan Anguilli,PA  Time MD responded:  [phone]

## 2014-08-02 ENCOUNTER — Encounter (HOSPITAL_COMMUNITY): Payer: BC Managed Care – PPO

## 2014-08-02 ENCOUNTER — Inpatient Hospital Stay (HOSPITAL_COMMUNITY): Payer: BC Managed Care – PPO

## 2014-08-02 ENCOUNTER — Inpatient Hospital Stay (HOSPITAL_COMMUNITY): Payer: BC Managed Care – PPO | Admitting: Physical Therapy

## 2014-08-02 LAB — CBC
HCT: 30.7 % — ABNORMAL LOW (ref 39.0–52.0)
Hemoglobin: 10.1 g/dL — ABNORMAL LOW (ref 13.0–17.0)
MCH: 27.7 pg (ref 26.0–34.0)
MCHC: 32.9 g/dL (ref 30.0–36.0)
MCV: 84.1 fL (ref 78.0–100.0)
PLATELETS: 107 10*3/uL — AB (ref 150–400)
RBC: 3.65 MIL/uL — ABNORMAL LOW (ref 4.22–5.81)
RDW: 18.2 % — AB (ref 11.5–15.5)
WBC: 7.2 10*3/uL (ref 4.0–10.5)

## 2014-08-02 LAB — BASIC METABOLIC PANEL
ANION GAP: 12 (ref 5–15)
BUN: 62 mg/dL — ABNORMAL HIGH (ref 6–23)
CO2: 22 mEq/L (ref 19–32)
CREATININE: 1.94 mg/dL — AB (ref 0.50–1.35)
Calcium: 6 mg/dL — CL (ref 8.4–10.5)
Chloride: 102 mEq/L (ref 96–112)
GFR calc non Af Amer: 41 mL/min — ABNORMAL LOW (ref >90)
GFR, EST AFRICAN AMERICAN: 48 mL/min — AB (ref >90)
Glucose, Bld: 209 mg/dL — ABNORMAL HIGH (ref 70–99)
POTASSIUM: 4.9 meq/L (ref 3.7–5.3)
Sodium: 136 mEq/L — ABNORMAL LOW (ref 137–147)

## 2014-08-02 LAB — BLOOD GAS, ARTERIAL
Acid-base deficit: 3.1 mmol/L — ABNORMAL HIGH (ref 0.0–2.0)
Bicarbonate: 20.9 mEq/L (ref 20.0–24.0)
Drawn by: 252031
FIO2: 1 %
O2 Saturation: 100 %
PCO2 ART: 35 mmHg (ref 35.0–45.0)
PEEP: 8 cmH2O
Patient temperature: 98.6
RATE: 22 resp/min
TCO2: 22 mmol/L (ref 0–100)
VT: 650 mL
pH, Arterial: 7.394 (ref 7.350–7.450)
pO2, Arterial: 303 mmHg — ABNORMAL HIGH (ref 80.0–100.0)

## 2014-08-02 LAB — APTT: aPTT: 32 seconds (ref 24–37)

## 2014-08-02 LAB — PROTIME-INR
INR: 1.38 (ref 0.00–1.49)
Prothrombin Time: 17 seconds — ABNORMAL HIGH (ref 11.6–15.2)

## 2014-08-02 LAB — URINALYSIS, ROUTINE W REFLEX MICROSCOPIC
BILIRUBIN URINE: NEGATIVE
Glucose, UA: NEGATIVE mg/dL
Ketones, ur: NEGATIVE mg/dL
Leukocytes, UA: NEGATIVE
Nitrite: NEGATIVE
Protein, ur: >300 mg/dL — AB
SPECIFIC GRAVITY, URINE: 1.018 (ref 1.005–1.030)
Urobilinogen, UA: 0.2 mg/dL (ref 0.0–1.0)
pH: 5 (ref 5.0–8.0)

## 2014-08-02 LAB — TRIGLYCERIDES: TRIGLYCERIDES: 160 mg/dL — AB (ref <150)

## 2014-08-02 LAB — URINE MICROSCOPIC-ADD ON

## 2014-08-02 LAB — PNEUMOCYSTIS JIROVECI SMEAR BY DFA: Pneumocystis jiroveci Ag: NEGATIVE

## 2014-08-02 LAB — ABO/RH: ABO/RH(D): A POS

## 2014-08-02 LAB — MAGNESIUM: Magnesium: 1.9 mg/dL (ref 1.5–2.5)

## 2014-08-02 LAB — PHOSPHORUS: PHOSPHORUS: 3.2 mg/dL (ref 2.3–4.6)

## 2014-08-02 LAB — LACTIC ACID, PLASMA: LACTIC ACID, VENOUS: 0.8 mmol/L (ref 0.5–2.2)

## 2014-08-02 LAB — PROCALCITONIN: Procalcitonin: 4.1 ng/mL

## 2014-08-02 LAB — CLOSTRIDIUM DIFFICILE BY PCR: Toxigenic C. Difficile by PCR: NEGATIVE

## 2014-08-02 MED ORDER — SODIUM CHLORIDE 0.9 % IV SOLN
2.0000 g | Freq: Once | INTRAVENOUS | Status: AC
  Administered 2014-08-02: 2 g via INTRAVENOUS
  Filled 2014-08-02: qty 20

## 2014-08-02 MED ORDER — MIDAZOLAM HCL 2 MG/2ML IJ SOLN
2.0000 mg | Freq: Once | INTRAMUSCULAR | Status: AC
  Administered 2014-08-02: 2 mg via INTRAVENOUS

## 2014-08-02 MED ORDER — SODIUM CHLORIDE 0.9 % IV SOLN
2.0000 g | Freq: Once | INTRAVENOUS | Status: AC
  Administered 2014-08-03: 2 g via INTRAVENOUS
  Filled 2014-08-02: qty 20

## 2014-08-02 MED ORDER — VORICONAZOLE 200 MG IV SOLR
4.0000 mg/kg | Freq: Two times a day (BID) | INTRAVENOUS | Status: DC
  Filled 2014-08-02: qty 320

## 2014-08-02 MED ORDER — VANCOMYCIN HCL IN DEXTROSE 750-5 MG/150ML-% IV SOLN
750.0000 mg | Freq: Two times a day (BID) | INTRAVENOUS | Status: DC
  Administered 2014-08-02 – 2014-08-05 (×6): 750 mg via INTRAVENOUS
  Filled 2014-08-02 (×8): qty 150

## 2014-08-02 MED ORDER — FENTANYL CITRATE 0.05 MG/ML IJ SOLN
100.0000 ug | Freq: Once | INTRAMUSCULAR | Status: AC
  Administered 2014-08-02: 100 ug via INTRAVENOUS

## 2014-08-02 MED ORDER — VORICONAZOLE 200 MG IV SOLR
200.0000 mg | Freq: Two times a day (BID) | INTRAVENOUS | Status: DC
  Administered 2014-08-02 – 2014-08-03 (×2): 200 mg via INTRAVENOUS
  Filled 2014-08-02 (×5): qty 200

## 2014-08-02 MED ORDER — ACD FORMULA A 0.73-2.45-2.2 GM/100ML VI SOLN
Status: AC
  Filled 2014-08-02: qty 1000

## 2014-08-02 MED ORDER — FENTANYL CITRATE 0.05 MG/ML IJ SOLN
100.0000 ug | Freq: Once | INTRAMUSCULAR | Status: AC
  Administered 2014-08-01: 100 ug via INTRAVENOUS

## 2014-08-02 MED ORDER — SODIUM CHLORIDE 0.9 % IV SOLN
500.0000 mg | Freq: Three times a day (TID) | INTRAVENOUS | Status: DC
  Administered 2014-08-02 – 2014-08-05 (×10): 500 mg via INTRAVENOUS
  Filled 2014-08-02 (×18): qty 500

## 2014-08-02 MED ORDER — FENTANYL CITRATE 0.05 MG/ML IJ SOLN: 50.0000 ug | Freq: Once | INTRAMUSCULAR | Status: AC

## 2014-08-02 MED ORDER — ACD FORMULA A 0.73-2.45-2.2 GM/100ML VI SOLN
500.0000 mL | Status: DC
  Filled 2014-08-02: qty 500

## 2014-08-02 MED ORDER — VORICONAZOLE 200 MG IV SOLR
200.0000 mg | Freq: Two times a day (BID) | INTRAVENOUS | Status: DC
  Administered 2014-08-02: 200 mg via INTRAVENOUS
  Filled 2014-08-02 (×2): qty 200

## 2014-08-02 MED ORDER — ACETAMINOPHEN 325 MG PO TABS: 650.0000 mg | ORAL_TABLET | ORAL | Status: DC | PRN

## 2014-08-02 MED ORDER — HEPARIN SODIUM (PORCINE) 1000 UNIT/ML IJ SOLN: 1000.0000 [IU] | Freq: Once | INTRAMUSCULAR | Status: AC

## 2014-08-02 MED ORDER — ETOMIDATE 2 MG/ML IV SOLN
20.0000 mg | Freq: Once | INTRAVENOUS | Status: AC
  Administered 2014-08-01: 20 mg via INTRAVENOUS

## 2014-08-02 MED ORDER — DIPHENHYDRAMINE HCL 25 MG PO CAPS: 25.0000 mg | ORAL_CAPSULE | Freq: Four times a day (QID) | ORAL | Status: DC | PRN

## 2014-08-02 MED ORDER — METHYLPREDNISOLONE SODIUM SUCC 125 MG IJ SOLR
125.0000 mg | Freq: Four times a day (QID) | INTRAMUSCULAR | Status: DC
  Administered 2014-08-02 – 2014-08-05 (×15): 125 mg via INTRAVENOUS
  Filled 2014-08-02 (×17): qty 2

## 2014-08-02 MED ORDER — ALBUMIN HUMAN 25 % IV SOLN: Freq: Once | INTRAVENOUS | Status: DC

## 2014-08-02 MED ORDER — SODIUM CHLORIDE 0.9 % IV SOLN
4.0000 g | Freq: Once | INTRAVENOUS | Status: AC
  Administered 2014-08-02: 4 g via INTRAVENOUS
  Filled 2014-08-02 (×2): qty 40

## 2014-08-02 MED ORDER — FUROSEMIDE 10 MG/ML IJ SOLN
40.0000 mg | INTRAMUSCULAR | Status: AC
  Administered 2014-08-02: 40 mg via INTRAVENOUS
  Filled 2014-08-02: qty 4

## 2014-08-02 MED ORDER — SODIUM CHLORIDE 0.9 % IV SOLN
0.0000 ug/h | INTRAVENOUS | Status: DC
  Administered 2014-08-02: 50 ug/h via INTRAVENOUS
  Administered 2014-08-02: 100 ug/h via INTRAVENOUS
  Filled 2014-08-02 (×3): qty 50

## 2014-08-02 MED ORDER — SODIUM CHLORIDE 0.9 % IV SOLN
INTRAVENOUS | Status: DC
Start: 2014-08-02 — End: 2014-08-05
  Administered 2014-08-02 – 2014-08-05 (×5): via INTRAVENOUS

## 2014-08-02 MED ORDER — MIDAZOLAM HCL 2 MG/2ML IJ SOLN
2.0000 mg | Freq: Once | INTRAMUSCULAR | Status: AC
  Administered 2014-08-01: 2 mg via INTRAVENOUS

## 2014-08-02 MED ORDER — FENTANYL BOLUS VIA INFUSION
50.0000 ug | INTRAVENOUS | Status: DC | PRN
  Filled 2014-08-02: qty 100

## 2014-08-02 MED ORDER — VANCOMYCIN HCL 10 G IV SOLR
1250.0000 mg | Freq: Once | INTRAVENOUS | Status: AC
  Administered 2014-08-02: 1250 mg via INTRAVENOUS
  Filled 2014-08-02: qty 1250

## 2014-08-02 MED ORDER — ALBUMIN HUMAN 25 % IV SOLN
INTRAVENOUS | Status: AC
  Administered 2014-08-02: 23:00:00 via INTRAVENOUS_CENTRAL
  Filled 2014-08-02 (×6): qty 200

## 2014-08-02 MED ORDER — ROCURONIUM BROMIDE 50 MG/5ML IV SOLN
20.0000 mg | Freq: Once | INTRAVENOUS | Status: AC
  Administered 2014-08-01: 20 mg via INTRAVENOUS

## 2014-08-02 MED ORDER — SODIUM CHLORIDE 0.9 % IV SOLN: Freq: Once | INTRAVENOUS | Status: DC

## 2014-08-02 NOTE — Progress Notes (Addendum)
ANTIBIOTIC CONSULT NOTE - INITIAL  Pharmacy Consult for vancomycin, imipenem,  voriconazole  Indication: resp failure and VAP  with chronic immune supp.   Allergies  Allergen Reactions  . Levaquin [Levofloxacin In D5w] Other (See Comments)    Rash and SOB    Patient Measurements: Height: 5' 10.08" (178 cm) Weight: 180 lb 12.4 oz (82 kg) IBW/kg (Calculated) : 73.18 Adjusted Body Weight:   Vital Signs: Temp: 98.2 F (36.8 C) (10/07 2327) Temp Source: Oral (10/07 2327) BP: 160/87 mmHg (10/08 0115) Pulse Rate: 95 (10/08 0115) Intake/Output from previous day:   Intake/Output from this shift:    Labs:  Recent Labs  08/01/14 0620 08/02/14 0500  WBC 4.0 7.2  HGB 9.8* 10.1*  PLT 71* 107*  CREATININE 1.91*  --    Estimated Creatinine Clearance: 52.7 ml/min (by C-G formula based on Cr of 1.91). No results found for this basename: VANCOTROUGH, VANCOPEAK, VANCORANDOM, GENTTROUGH, GENTPEAK, GENTRANDOM, TOBRATROUGH, TOBRAPEAK, TOBRARND, AMIKACINPEAK, AMIKACINTROU, AMIKACIN,  in the last 72 hours   Microbiology: No results found for this or any previous visit (from the past 720 hour(s)).  Medical History: Past Medical History  Diagnosis Date  . Systemic lupus erythematosus     with pulmonary, renal, cardiac, skin and joint involvement  . HTN (hypertension)   . CKD (chronic kidney disease)   . 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 antiseptic oral rinse  7 mL Mouth Rinse QID  . atovaquone  1,500 mg Per Tube Q breakfast  . chlorhexidine  15 mL Mouth Rinse BID  . fentaNYL      . fentaNYL      . fentaNYL  50 mcg Intravenous Once  . hydroxychloroquine  400 mg Per Tube Daily  . ipratropium-albuterol  3 mL Nebulization Q6H  . levothyroxine  75 mcg Intravenous Daily  . methylPREDNISolone (SOLU-MEDROL) injection  125 mg Intravenous Q6H  . midazolam      . midazolam      . pantoprazole (PROTONIX) IV  40 mg Intravenous Daily  . propofol      .  sodium chloride  3 mL Intravenous Q12H   Infusions:  . sodium chloride    . fentaNYL infusion INTRAVENOUS    . propofol     PRN: sodium chloride, sodium chloride, albuterol, fentaNYL, sodium chloride Assessment: Chronically ill pt with lupus [facility] now intubated and sedated with ARF. Imipenem vanc and voriconazole to cover for VAP and aspergillus. From recent sputum cx's   Goal of Therapy:  Vancomycin trough level 15-20 mcg/ml  Plan:  Vancomycin 1250mg  x1 then 750mg  q12h Imipenem 500 mg iv q8h  voriconazole 200 iv q12.   Janice CoffinEarl, William Jonathan 08/02/2014,1:33 AM  Addendum: Will increase Voriconazole dose to 4 mg/kg (320 mg) IV Q 12 hours per IDSA guidelines given CrCl > 50 mL/min.   Vinnie LevelBenjamin Wilba Mutz, PharmD.  Clinical Pharmacist Pager [phone]  Follow Up:  On further assessment, given elevated transaminases, will revert back to reduced dose of 200 mg twice daily.   Vinnie LevelBenjamin Tynasia Mccaul, PharmD.  Clinical Pharmacist Pager [phone]

## 2014-08-02 NOTE — Procedures (Signed)
PCCM Bronchoscopy Procedure Note  The patient was informed of the risks (including but not limited to bleeding, infection, respiratory failure, lung injury, tooth/oral injury) and benefits of the procedure and gave consent, see chart.  Indication: Acute respiratory failure  [facility]  Condition pre procedure: Critically ill, on vent  Medications for procedure: Propofol gtt, versed 2mg , fentanyl 100mcg IV  Procedure description: The bronchoscope was introduced through the endotracheal tube and passed to the bilateral lungs to the level of the subsegmental bronchi throughout the tracheobronchial tree.  Airway exam revealed acute inflammation and thin bloody secretions throughout the tracheobronchial tree.  There were no airway lesions and no focal source of bleeding.  Procedures performed: BAL RML lateral segment> sequential BAL became progressively bloodier, see image below initial specimen at top, final specimen at bottom.  Specimens sent: BAL> bacterial, fungal, afb, viral  Condition post procedure: critically ill on vent  EBL: < 5cc  Complications: none  Post procedure diagnosis: Severe acute respiratory failure, on vent    Heber CarolinaBrent Colbin Jovel, MD [place] PCCM Pager: [phone] Cell: [phone] If no response, call [phone]

## 2014-08-02 NOTE — Progress Notes (Signed)
CXR report received from Dr. Alfredo BattyMattern.  IJ catheter tip shown likely in right atrium.  Dr. Kendrick FriesMcQuaid at bedside and aware.  Order to use central line in current position.    Roselie AwkwardShannon Amaliya Whitelaw, RN

## 2014-08-02 NOTE — Progress Notes (Signed)
Patient alert and oriented x4 upon assessment at 1959. Lungs clear to auscultation. Vitals within normal limits. Denied pain/discomfort. Visitors at bedside.   This nurse was paged at approximately 2100 with patient complaining of SOB. Upon entering patient's room, patient HOB noted at 30 degrees. "I'm ok now. I felt a little out of breath when I lower the head of my bed." Lungs clear to auscultation.  O2 sat fluctuated between 87 and 90. HOB elevated at 90 degree. Oxygen 2L via [place] administered as ordered. RT paged for breathing treatment. O2 sat stable at 92% at this time.   At approximately 2210, tech reported to this nurse that patient was coughing out blood. Patient sitting upright upon entering room. Is in respiratory distress; sweating profusely. Minimal amount of bright red blood observed on sheet. RT at bedside. Crackles heard by RT in right upper and lower lobes. Vitals obtained:149/102, 97.9, 46, 22, 85% 4L [place]. Albuterol 2.5 mg given. 6L via Rebreather mask administered. Rapid response nurse paged. Delle ReiningPamela Love, PA spoke to Rapid Response Nurse. As per order, ABG and Stat chest x-ray obtained. On-call ICU physician at bedside at 2237. Patient in bed, escorted off unit @ 2250 to  ICU; accompanied by ICU physician, Rapid Response Nurse, RT and a Rehab nurse.  Report given to [facility] nurse, at 2225. Baylea Milburn A. Zarya Lasseigne, RN

## 2014-08-02 NOTE — Progress Notes (Signed)
Utilization review completed. Melaysia Streed, RN, BSN. 

## 2014-08-02 NOTE — Procedures (Signed)
Hemodialysis Catheter Insertion Procedure Note Sheralyn BoatmanDennis R Bibbins [id number] [birth year]  Procedure: Insertion of Hemodialysis Catheter Indications: Plasma Exchange  Procedure Details Consent: Risks of procedure as well as the alternatives and risks of each were explained to the (patient/caregiver).  Consent for procedure obtained. Time Out: Verified patient identification, verified procedure, site/side was marked, verified correct patient position, special equipment/implants available, medications/allergies/relevent history reviewed, required imaging and test results available.  Performed  Maximum sterile technique was used including antiseptics, cap, gloves, gown, hand hygiene, mask and sheet. Skin prep: Chlorhexidine; local anesthetic administered A antimicrobial bonded/coated triple lumen catheter was placed in the right internal jugular vein using the Seldinger technique.  Evaluation Blood flow good Complications: No apparent complications Patient did tolerate procedure well. Chest X-ray ordered to verify placement.  CXR: pending.  Procedure performed under direct ultrasound guidance for real time vessel cannulation.      [name], PA - [facility] [place] Pgr: [phone]  or [phone]

## 2014-08-02 NOTE — Progress Notes (Signed)
Pt HR sustaining lows 50s SB, occasionally dropping to 49.  SBP 110s-120s, responds to voice, following commands.  Dr. Molli KnockYacoub notified, no new orders received.  Will continue to monitor.   Roselie AwkwardShannon Quintez Maselli, RN

## 2014-08-02 NOTE — Progress Notes (Signed)
  Echocardiogram 2D Echocardiogram has been performed.  Dilpreet Faires FRANCES 08/02/2014, 3:46 PM

## 2014-08-02 NOTE — Discharge Summary (Signed)
Physician Discharge Summary  Patient ID: Thomas Andrews MRN: [id number] DOB/AGE: 1971-02-02 43 y.o.  Admit date: 07/31/2014 Discharge date: 08/02/2014  Discharge Diagnoses:  Principal Problem:   Debility Active Problems:   Lupus nephritis   Diffuse pulmonary alveolar hemorrhage   Acute blood loss anemia   Thrombocytopenia   Hypocalcemia   Pulmonary aspergillosis   Acute on chronic renal failure   Discharged Condition: guarded.   Significant Diagnostic Studies:  Dg Chest Port 1 View  08/01/2014   CLINICAL DATA:  Respiratory distress. Coughing up blood. Initial encounter.  EXAM: PORTABLE CHEST - 1 VIEW  COMPARISON:  None.  FINDINGS: Extensive bilateral airspace disease is present. Heart size is enlarged. There is no pneumothorax or pleural effusion.  IMPRESSION: Extensive bilateral airspace disease identified and has appearance most compatible with pneumonia rather than edema.   Electronically Signed   By: Drusilla Kanner M.D.   On: 08/01/2014 22:50    Labs:  Basic Metabolic Panel:  Recent Labs Lab 08/01/14 0620  NA 138  K 4.9  CL 105  CO2 24  GLUCOSE 89  BUN 60*  CREATININE 1.91*  CALCIUM 6.3*  MG  --   PHOS  --     CBC:  Recent Labs Lab 08/01/14 0620  WBC 4.0  NEUTROABS 3.8  HGB 9.8*  HCT 29.5*  MCV 83.6  PLT 71*    CBG:  Recent Labs Lab 07/31/14 2114 08/01/14 0714 08/01/14 1155 08/01/14 1605 08/01/14 2110  GLUCAP 159* 91 118* 161* 165*    Brief HPI:   MR. Thomas Andrews is a 43 year old male with history of SLE with multisystem involvement who was admitted to [facility] on 07/07/14 with several weeks of SOB, BLE edema progressive to blood tinged productive cough, fever and chills. Patient reported to be on recent steroid taper by his rheumatologist (Dr. Janne Lab). He was started on IV antibiotics for patchy airspace disease and IV diuretics for edema. CT chest showed multifocal areas of consolidation and requires intubation with high frequency  oscillation for respiratory failure. Bronchoscopy showed at bedside revealed diffuse blood staining the airways and he was started on high dose steroids for diffuse alveolar hemorrhage. Rheumatology was consulted for input and home azathioprine was discontinued and patient was treated with cytoxan 09/17 with plans to repeat every 4 weeks. 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 He was treated with high doses of prednisone with recommendations for slow taper. He underwent PLEX 9/15-9/18 and tolerated extubation on 09/19 and IV antibiotics were discontinued due to low suspicion of infection.   On 09/23, he developed acute worsening of DAH requiring re-intubation and 3 round of PLEX as well as Rituximab on 09/26 with recommendations for second dose in 2 weeks. Acute on chronic renal failure treated with CVVHD to iHD with improvement in renal status and UOP. Dialysis was discontinued by end of September with reports of good urine output and recommendations to utilize lasix prn for any volume issues. Renal biopsy was done on 9/30. 2D echo done revealing EF 50% with mild LVH and mild RV dysfunction. He started having fevers on 10/01 and cultures of bronch specimen grew out aspergillus on 10/02.  Patient also had candida in urine as well as oral thrush. Although though to be a contaminant due to immuno suppresion ID/Dr. Elie Goody recommended treating patient with Voriconazole 200mg  bid for at least 12 weeks with weekly monitoring of  trough levels.  Blood pressures have been managed on oral medications and nicardipine drip weaned off. Recommendation for Rituximab 1000 mg on 10/09 and Cyclophosphamide 450 mg on 10/17 at [facility] infusion clinic. Plans for slow prednisone taper by 10 mg ever two weeks--next decrease to 70 mg/day on 10/14. He is to continue on PJP prophylaxis with bactrim DS MWF, plaquenil 400  mg/day and CCB for Raynaud's. Swallow evaluation done 09/30 showing no evidence of dysphagia and patient tolerating regular diet with aspiration precautions. Had pancytopenia with platelets down to 45, ANC 2.2 as well as drop in H/H to 6.7 on 10/03 and transfused with 2 units PRBC. Pancytopenia expected as SE of cytoxan and to transfuse platelets if <10. Hypocalcemia treated with calcium supplements. Therapy ongoing and patient showing improvement in tolerance and activity level. CIR recommended by rehab team for further therapy.    Hospital Course: Thomas Andrews was admitted to rehab 07/31/2014 for inpatient therapies to consist of PT, ST and OT at least three hours five days a week. Blood sugars were monitored on ac/hs basis and SSI were used for steroid induced hyperglycemia. He reported problems with abdominal distension past admission and post void checks done revealed volumes  From 240-267 cc. He was instructed on compliance with bowel program and simethicone was added for GI symptoms. Respiratory status was stable and he was tolerating po's without difficulty. Follow up labs in am revealed resolution of neutropenia with improvement in platelets. H/H was stable at 9.8.  PT evaluation done in am revealed patient requiring max assist with mobility. On evening of 08/01/14 patient developed progressive hypoxia with cough and was noted to be coughing up blood tinged sputum. CCM ws contacted due to concern of recurrent DAH and patient was transferred to ICU for management and treatment.     Disposition:  ICU  Diet: NPO    Medication List    ASK your doctor about these medications       albuterol 108 (90 BASE) MCG/ACT inhaler  Commonly known as:  PROVENTIL HFA;VENTOLIN HFA  Inhale 2 puffs into the lungs every 6 (six) hours as needed for wheezing or shortness of breath.     amLODipine 10 MG tablet  Commonly known as:  NORVASC  Take 10 mg by mouth daily.     atovaquone 750 MG/5ML suspension   Commonly known as:  MEPRON  Take 1,500 mg by mouth daily.     calcium citrate-vitamin D 315-200 MG-UNIT per tablet  Commonly known as:  CITRACAL+D  Take 1 tablet by mouth daily.     carvedilol 25 MG tablet  Commonly known as:  COREG  Take 25 mg by mouth 2 (two) times daily with a meal.     doxazosin 2 MG tablet  Commonly known as:  CARDURA  Take 2 mg by mouth 2 (two) times daily.     hydrALAZINE 50 MG tablet  Commonly known as:  APRESOLINE  Take 50 mg by mouth every 8 (eight) hours.     hydroxychloroquine 200 MG tablet  Commonly known as:  PLAQUENIL  Take 400 mg by mouth daily.     levothyroxine 150 MCG tablet  Commonly known as:  SYNTHROID, LEVOTHROID  Take 150 mcg by mouth daily before breakfast.     Melatonin 3 MG Caps  Take 3 mg by mouth at bedtime.     pantoprazole 40 MG tablet  Commonly known as:  PROTONIX  Take 40 mg by mouth daily.     polyethylene  glycol packet  Commonly known as:  MIRALAX / GLYCOLAX  Take 17 g by mouth daily as needed for moderate constipation.     predniSONE 10 MG tablet  Commonly known as:  DELTASONE  Take 50-80 mg by mouth daily with breakfast. Take 80 mg by mouth daily until 08/09/2014. Take 70 mg by mouth daily from 08/10/2014-08/23/2014. Take 60 mg by mouth daily from 08/24/2014-09/06/2014. Take 50 mg by mouth daily from 09/07/2014-09/20/2014.     sennosides-docusate sodium 8.6-50 MG tablet  Commonly known as:  SENOKOT-S  Take 2 tablets by mouth 2 (two) times daily.     voriconazole 200 MG tablet  Commonly known as:  VFEND  Take 200 mg by mouth 2 (two) times daily.           Follow-up Information   Follow up with Ranelle OysterSWARTZ,ZACHARY T, MD.   Specialty:  Physical Medicine and Rehabilitation   Contact information:   [address], [address] [place] [address] [phone]       Signed: Jacquelynn CreeLove, Pamela S 08/02/2014, 12:18 PM

## 2014-08-02 NOTE — Consult Note (Signed)
Thomas Andrews Admit Date: 08/01/2014 08/02/2014 Thomas Andrews Requesting Physician:  Molli Knock  Reason for Consult:  DAH, ? Need for TPE HPI:  81M admitted [facility] 07/07/14 and found to have SLE DAH, lupus nephritis.  Had VDRF, req RRT but now liberated (SCr around 2) Admitted to [facility] CIR 10/6.  On 10/7 developed hemoptysis and respiratory distress, eventually intubation.  pCXR w/ extensive b/l airspace disease.  Pt rec bronchoscopy with BAL findings consistent with DAH (images reviewed in Procedures)    Plan is for transfer to [facility] pending bed availability.    Pt on voriconazole for ? Pulmonary aspergillosis.  I/Os: I/O last 3 completed shifts: In: 3463.6 [I.V.:2413.6; NG/GT:330; IV Piggyback:720] Out: 1035 [Urine:1035]  ROS Balance of 12 systems is negative w/ exceptions as above  PMH  Past Medical History  Diagnosis Date  . Systemic lupus erythematosus     with pulmonary, renal, cardiac, skin and joint involvement  . HTN (hypertension)   . CKD (chronic kidney disease)   . 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 Cholecystectomy     FH No family history on file. SH  has no tobacco, alcohol, and drug history on file. Allergies  Allergies  Allergen Reactions  . Levaquin [Levofloxacin In D5w] Other (See Comments)    Rash and SOB   Home medications Prior to Admission medications   Medication Sig Start Date End Date Taking? Authorizing Provider  albuterol (PROVENTIL HFA;VENTOLIN HFA) 108 (90 BASE) MCG/ACT inhaler Inhale 2 puffs into the lungs every 6 (six) hours as needed for wheezing or shortness of breath.    Historical Provider, MD  amLODipine (NORVASC) 10 MG tablet Take 10 mg by mouth daily.    Historical Provider, MD  atovaquone (MEPRON) 750 MG/5ML suspension Take 1,500 mg by mouth daily.    Historical Provider, MD  calcium citrate-vitamin D (CITRACAL+D) 315-200 MG-UNIT per tablet Take 1 tablet by mouth daily.     Historical Provider, MD  carvedilol (COREG) 25 MG tablet Take 25 mg by mouth 2 (two) times daily with a meal.    Historical Provider, MD  doxazosin (CARDURA) 2 MG tablet Take 2 mg by mouth 2 (two) times daily.    Historical Provider, MD  hydrALAZINE (APRESOLINE) 50 MG tablet Take 50 mg by mouth every 8 (eight) hours.    Historical Provider, MD  hydroxychloroquine (PLAQUENIL) 200 MG tablet Take 400 mg by mouth daily.    Historical Provider, MD  levothyroxine (SYNTHROID, LEVOTHROID) 150 MCG tablet Take 150 mcg by mouth daily before breakfast.    Historical Provider, MD  Melatonin 3 MG CAPS Take 3 mg by mouth at bedtime.    Historical Provider, MD  pantoprazole (PROTONIX) 40 MG tablet Take 40 mg by mouth daily.    Historical Provider, MD  polyethylene glycol (MIRALAX / GLYCOLAX) packet Take 17 g by mouth daily as needed for moderate constipation.    Historical Provider, MD  predniSONE (DELTASONE) 10 MG tablet Take 50-80 mg by mouth daily with breakfast. Take 80 mg by mouth daily until 08/09/2014. Take 70 mg by mouth daily from 08/10/2014-08/23/2014. Take 60 mg by mouth daily from 08/24/2014-09/06/2014. Take 50 mg by mouth daily from 09/07/2014-09/20/2014. 07/31/14   Historical Provider, MD  sennosides-docusate sodium (SENOKOT-S) 8.6-50 MG tablet Take 2 tablets by mouth 2 (two) times daily.    Historical Provider, MD  voriconazole (VFEND) 200 MG tablet Take 200 mg by  mouth 2 (two) times daily.    Historical Provider, MD    Current Medications Scheduled Meds: . sodium chloride   Intravenous Once  . therapeutic plasma exchange solution   Dialysis Q1 Hr x 5  . antiseptic oral rinse  7 mL Mouth Rinse QID  . atovaquone  1,500 mg Per Tube Q breakfast  . calcium gluconate IVPB  4 g Intravenous Once  . chlorhexidine  15 mL Mouth Rinse BID  . citrate dextrose      . hydroxychloroquine  400 mg Per Tube Daily  . imipenem-cilastatin  500 mg Intravenous 3 times per day  . ipratropium-albuterol  3 mL  Nebulization Q6H  . levothyroxine  75 mcg Intravenous Daily  . methylPREDNISolone (SOLU-MEDROL) injection  125 mg Intravenous Q6H  . pantoprazole (PROTONIX) IV  40 mg Intravenous Daily  . sodium chloride  3 mL Intravenous Q12H  . vancomycin  750 mg Intravenous Q12H  . voriconazole  200 mg Intravenous Q12H   Continuous Infusions: . sodium chloride 100 mL/hr at 08/02/14 2000  . fentaNYL infusion INTRAVENOUS 125 mcg/hr (08/02/14 2000)  . propofol 25 mcg/kg/min (08/02/14 2000)   PRN Meds:.sodium chloride, sodium chloride, albuterol, fentaNYL, sodium chloride  CBC  Recent Labs Lab 08/01/14 0620 08/02/14 0500  WBC 4.0 7.2  NEUTROABS 3.8  --   HGB 9.8* 10.1*  HCT 29.5* 30.7*  MCV 83.6 84.1  PLT 71* 107*   Basic Metabolic Panel  Recent Labs Lab 08/01/14 0620 08/02/14 0500  NA 138 136*  K 4.9 4.9  CL 105 102  CO2 24 22  GLUCOSE 89 209*  BUN 60* 62*  CREATININE 1.91* 1.94*  CALCIUM 6.3* 6.0*  PHOS  --  3.2    Physical Exam  Blood pressure 125/79, pulse 52, temperature 97.5 F (36.4 C), temperature source Oral, resp. rate 22, height 5' 10.08" (1.78 m), weight 81 kg (178 lb 9.2 oz), SpO2 100.00%. 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  [facility] and about [facility].  Pt pending transfer but they agree with initiation of TPE again tonight.    1. DAH 2. SLE involving lung, kidney, heart, skin 3. CKD3 4. Hypocalcemia 5. VDRF   PLAN 1. Will plan for TPE donight 1.5 PV, albumin as replacement fluid but will give 2u FFP at end of procedure to reverse any worsened coagulopathy 2. Hypocalcemia noted, and will be aggressive with calcium repletion before and during procedure 3. Tentatively plan for TPE #2  tomorrow 4. [facility]   Chela Sutphen MD 08/02/2014, 8:17 PM

## 2014-08-02 NOTE — Progress Notes (Signed)
CRITICAL VALUE ALERT  Critical value received:  Ca 6  Date of notification:  08/02/14  Time of notification:  0133  Critical value read back:Yes.    Nurse who received alert:  Devona KonigAvery Daniel RN  MD notified (1st page):  Dr. Craige CottaSood  Time of first page:  0157  MD notified (2nd page):  Time of second page:  Responding MD:  Dr. Craige CottaSood  Time MD responded:  [phone]

## 2014-08-02 NOTE — Progress Notes (Signed)
PULMONARY / CRITICAL CARE MEDICINE   Name: Thomas Andrews MRN: [id number] DOB: 08-28-1971    ADMISSION DATE:  08/01/2014 CONSULTATION DATE:  08/02/2014  REFERRING MD :  Riley Kill  CHIEF COMPLAINT:  Hemoptysis  INITIAL PRESENTATION: 43 y.o. M with hx SLE, HTN, CKD transferred to CIR on 10/7 from [facility] where he had recent prolonged hospitalization during which he was intubated twice (extubated 9/25) and had a 3 day course of HFOV, cyclophosphamide, rituximab and amicar.  In addition, he received 3 rounds of PLEX and 3 days of pulse dose steroids.  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   On evening of 10/7, pt reportedly had 2 episodes of scant hemoptysis.  Later, he began to have respiratory distress requiring NRB.  PCCM was consulted and upon arrival at bedside, pt noted to be in moderate respiratory distress.  He was transferred to the ICU and started on BiPAP.  Unfortunately, SpO2 began to drop and pt's work of breathing began to increase.  He subsequently required intubation.  STUDIES:    SIGNIFICANT EVENTS: 10/7 - transferred to CIR from [facility]. 10/7 - PCCM consulted for hemoptysis, pt developed acute hypoxemic respiratory failure which failed BiPAP and required intubation. 10/7 - Bronched at bedside>> acute inflammation, thin bloody secretions, no focal source bleeding, sequential BAL became progressively bloodier   SUBJECTIVE:  Hemodynamically stable.  Denies pain.     VITAL SIGNS: Temp:  [97.6 F (36.4 C)-98.4 F (36.9 C)] 97.6 F (36.4 C) (10/08 0824) Pulse Rate:  [58-97] 62 (10/08 0835) Resp:  [16-59] 22 (10/08 0835) BP: (102-160)/(64-124) 153/77 mmHg (10/08 0835) SpO2:  [60 %-100 %] 100 % (10/08 0835) Arterial Line BP: (127-157)/(65-79) 146/72 mmHg (10/08 0700) FiO2 (%):  [40 %-100 %] 40 % (10/08 0835) Weight:  [178 lb 9.2 oz (81 kg)-180 lb 12.4 oz (82 kg)] 178 lb 9.2 oz (81 kg) (10/08  0500) HEMODYNAMICS:   VENTILATOR SETTINGS: Vent Mode:  [-] PRVC FiO2 (%):  [40 %-100 %] 40 % Set Rate:  [15 bmp-22 bmp] 22 bmp Vt Set:  [500 mL-650 mL] 650 mL PEEP:  [8 cmH20] 8 cmH20 Plateau Pressure:  [28 cmH20-34 cmH20] 34 cmH20 INTAKE / OUTPUT: Intake/Output     10/07 0701 - 10/08 0700 10/08 0701 - 10/09 0700   I.V. (mL/kg) 797 (9.8) 129.7 (1.6)   NG/GT  270   IV Piggyback 350 120   Total Intake(mL/kg) 1147 (14.2) 519.7 (6.4)   Urine (mL/kg/hr) 260 135 (0.5)   Total Output 260 135   Net +887 +384.7        Urine Occurrence 1 x      PHYSICAL EXAMINATION: General: WDWN young male, NAD on vent Neuro: sedated, RASS 0, nods appropriately, MAE HEENT: [place]/AT. PERRL, sclerae anicteric. Cardiovascular: RRR, no M/R/G.  Lungs: resps even non labored on full vent support, few scattered rhonchi Abdomen: BS x 4, soft, NT/ND.  Musculoskeletal: No gross deformities, no edema.  Skin: Intact, warm, no rashes.  LABS: PULMONARY  Recent Labs Lab 08/01/14 2217 08/02/14 0353  PHART 7.437 7.394  PCO2ART 29.7* 35.0  PO2ART 52.0* 303.0*  HCO3 19.7* 20.9  TCO2 20.6 22.0  O2SAT 85.2 100.0    CBC  Recent Labs Lab 08/01/14 0620 08/02/14 0500  HGB 9.8* 10.1*  HCT 29.5* 30.7*  WBC 4.0 7.2  PLT 71* 107*    COAGULATION No results found for this basename: INR,  in the last 168 hours  CARDIAC  No results found for this basename: TROPONINI,  in the last 168 hours No results found for this basename: PROBNP,  in the last 168 hours   CHEMISTRY  Recent Labs Lab 08/01/14 0620 08/02/14 0500  NA 138 136*  K 4.9 4.9  CL 105 102  CO2 24 22  GLUCOSE 89 209*  BUN 60* 62*  CREATININE 1.91* 1.94*  CALCIUM 6.3* 6.0*  MG  --  1.9  PHOS  --  3.2   Estimated Creatinine Clearance: 51.9 ml/min (by C-G formula based on Cr of 1.94).   LIVER  Recent Labs Lab 08/01/14 0620  AST 151*  ALT 165*  ALKPHOS 47  BILITOT 0.3  PROT 4.8*  ALBUMIN 2.0*     INFECTIOUS No results  found for this basename: LATICACIDVEN, PROCALCITON,  in the last 168 hours   ENDOCRINE CBG (last 3)   Recent Labs  08/01/14 1155 08/01/14 1605 08/01/14 2110  GLUCAP 118* 161* 165*         IMAGING x48h Dg Chest Port 1 View  08/01/2014   CLINICAL DATA:  Respiratory distress. Coughing up blood. Initial encounter.  EXAM: PORTABLE CHEST - 1 VIEW  COMPARISON:  None.  FINDINGS: Extensive bilateral airspace disease is present. Heart size is enlarged. There is no pneumothorax or pleural effusion.  IMPRESSION: Extensive bilateral airspace disease identified and has appearance most compatible with pneumonia rather than edema.   Electronically Signed   By: Drusilla Kannerhomas  Dalessio M.D.   On: 08/01/2014 22:50      ASSESSMENT / PLAN:  PULMONARY OETT 10/7 >>> A: Acute hypoxemic respiratory failure Hemoptysis Recent refractory lupus pneumonitis  DAH - sequential BAL performed at bedside supportive of DAH (see image in bronch note). P:   Full mechanical support, wean as able. VAP bundle. DuoNebs / Albuterol. Pulse steroids - solumedrol 125mg  q6hrs x 3 days, then start taper. 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 P:  Hold outpatient norvasc, coreg, cardura, apresoline.  RENAL A:   CKD Baseline Cr when he left [facility] on 10/6 was 2 Lupus nephritis P:   NS @ 100. F/u chem   GASTROINTESTINAL A:   GI prophylaxis Nutrition P:   SUP: Pantoprazole. Start TF 10/8  HEMATOLOGIC A:   Anemia of chronic disease +/- acute blood loss Thrombocytopenia VTE Prophylaxis P:  Transfuse per usual ICU guidelines. SCD's only. F/u cbc   INFECTIOUS A:   Recent aspergillus in sputum culture from 10/1 PCP prophylaxis HCAP? P:   BCx2 10/7 >>> UCx 10/7 >>> Sputum Cx 10/7 >>> RVP 10/7 >>> PCP Stain 10/7 >>> Fungal Stain 10/7 >>> CDiff 10/7>>>NEG Aspergillus galactomannan Ag 10/7 >>> Abx: Mepron, start date 10/8, day 1/x. Abx: Voriconazole, start date  10/8, day 1/x. Abx: Vanc start date 10/8, day 1/x Abx: Imipenem start date 10/8 day 1/x  ENDOCRINE A:   At risk relative A.I. - has been on slow high dose prednisone taper. P:   Solumedrol. CBG's q4hr. SSI.  NEUROLOGIC A:   Acute metabolic encephalopathy P:   Sedation:  Propofol gtt / Fentanyl gtt. RASS goal: 0 to -1. Daily WUA.  RHEUMATOLOGIC A: SLE - suspect flare P: Continue plaquenil. Was previously on prednisone taper - change to Solumedrol pulse dose now. Per CIR note, next dose Rituxan 1000mg  due Friday 10/9, and next dose Cytoxan 450mg  on Saturday 10/17 -- may need earlier Check dS DNA, C3 and C4 complement - pending  Needs close rheumatology  f/u   TODAY'S  STAFF MD SUMMARY: 44 y.o. M with SLE, just transferred to CIR on 10/7 from [facility] where he was ventilated for [facility].  Developed respiratory distress + scant hemoptysis while on CIR evening of 10/7.  Required transfer to ICU and intubation.  Bedside bronch done, sequential BAL suggestive of DAH.  Pan cultures pending, empiric abx for HCAP (doubt).   Needs tx back to [facility] - will begin process (beds full as of 11 am and no call back from [facility]). NP willl call our renal service t start PLEX 08/02/2014/. We will do scheduled rituxan 08/03/14 per [facility] schedule. We cannot get inpatient rheum consult at [facility] per NP    Family updated at bedside by NP  Dirk Dress, NP 08/02/2014  10:29 AM Pager: [phone] or [phone]    The patient is critically ill with multiple organ systems failure and requires high complexity decision making for assessment and support, frequent evaluation and titration of therapies, application of advanced monitoring technologies and extensive interpretation of multiple databases.   Critical Care Time devoted to patient care services described in this note is  35  Minutes.  Dr. Kalman Shan, M.D., [facility].C.P Pulmonary and Critical Care Medicine Staff Physician [place]  [facility] [place] Pulmonary and Critical Care Pager: [phone], If no answer or between  15:00h - 7:00h: call [phone]  08/02/2014 11:04 AM

## 2014-08-02 NOTE — Procedures (Signed)
Arterial Catheter Insertion Procedure Note Thomas BoatmanDennis R Mcelhannon [id number] 1971/05/28  Procedure: Insertion of Arterial Catheter  Indications: Blood pressure monitoring and Frequent blood sampling  Procedure Details Consent: Unable to obtain consent because of altered level of consciousness. Time Out: Verified patient identification, verified procedure, site/side was marked, verified correct patient position, special equipment/implants available, medications/allergies/relevent history reviewed, required imaging and test results available.  Performed  Maximum sterile technique was used including antiseptics, cap, gloves, gown, hand hygiene, mask and sheet. Skin prep: Chlorhexidine; local anesthetic administered 20 gauge catheter was inserted into left radial artery using the Seldinger technique.  Evaluation Blood flow good; BP tracing good. Complications: No apparent complications.   Loma BostonJoyce, Carroll Lingelbach L 08/02/2014

## 2014-08-03 ENCOUNTER — Inpatient Hospital Stay (HOSPITAL_COMMUNITY): Payer: BC Managed Care – PPO

## 2014-08-03 ENCOUNTER — Encounter (HOSPITAL_COMMUNITY): Payer: Self-pay | Admitting: *Deleted

## 2014-08-03 DIAGNOSIS — I5041 Acute combined systolic (congestive) and diastolic (congestive) heart failure: Secondary | ICD-10-CM

## 2014-08-03 DIAGNOSIS — R5381 Other malaise: Secondary | ICD-10-CM

## 2014-08-03 LAB — CBC
HCT: 22.9 % — ABNORMAL LOW (ref 39.0–52.0)
Hemoglobin: 7.4 g/dL — ABNORMAL LOW (ref 13.0–17.0)
MCH: 27 pg (ref 26.0–34.0)
MCHC: 32.3 g/dL (ref 30.0–36.0)
MCV: 83.6 fL (ref 78.0–100.0)
Platelets: 57 10*3/uL — ABNORMAL LOW (ref 150–400)
RBC: 2.74 MIL/uL — ABNORMAL LOW (ref 4.22–5.81)
RDW: 18.2 % — ABNORMAL HIGH (ref 11.5–15.5)
WBC: 1.3 10*3/uL — CL (ref 4.0–10.5)

## 2014-08-03 LAB — POCT I-STAT, CHEM 8
BUN: 57 mg/dL — ABNORMAL HIGH (ref 6–23)
BUN: 53 mg/dL — ABNORMAL HIGH (ref 6–23)
BUN: 44 mg/dL — ABNORMAL HIGH (ref 6–23)
BUN: 50 mg/dL — AB (ref 6–23)
CALCIUM ION: 0.95 mmol/L — AB (ref 1.12–1.23)
CHLORIDE: 111 meq/L (ref 96–112)
CREATININE: 1.8 mg/dL — AB (ref 0.50–1.35)
Calcium, Ion: 0.8 mmol/L — ABNORMAL LOW (ref 1.12–1.23)
Calcium, Ion: 0.91 mmol/L — ABNORMAL LOW (ref 1.12–1.23)
Calcium, Ion: 0.99 mmol/L — ABNORMAL LOW (ref 1.12–1.23)
Chloride: 108 mEq/L (ref 96–112)
Chloride: 110 mEq/L (ref 96–112)
Chloride: 114 mEq/L — ABNORMAL HIGH (ref 96–112)
Creatinine, Ser: 2.1 mg/dL — ABNORMAL HIGH (ref 0.50–1.35)
Creatinine, Ser: 1.6 mg/dL — ABNORMAL HIGH (ref 0.50–1.35)
Creatinine, Ser: 1.9 mg/dL — ABNORMAL HIGH (ref 0.50–1.35)
GLUCOSE: 93 mg/dL (ref 70–99)
GLUCOSE: 129 mg/dL — AB (ref 70–99)
Glucose, Bld: 108 mg/dL — ABNORMAL HIGH (ref 70–99)
Glucose, Bld: 109 mg/dL — ABNORMAL HIGH (ref 70–99)
HCT: 23 % — ABNORMAL LOW (ref 39.0–52.0)
HEMATOCRIT: 24 % — AB (ref 39.0–52.0)
HEMATOCRIT: 18 % — AB (ref 39.0–52.0)
HEMATOCRIT: 25 % — AB (ref 39.0–52.0)
HEMOGLOBIN: 7.8 g/dL — AB (ref 13.0–17.0)
HEMOGLOBIN: 6.1 g/dL — AB (ref 13.0–17.0)
Hemoglobin: 8.2 g/dL — ABNORMAL LOW (ref 13.0–17.0)
Hemoglobin: 8.5 g/dL — ABNORMAL LOW (ref 13.0–17.0)
Potassium: 4.6 mEq/L (ref 3.7–5.3)
Potassium: 4.3 mEq/L (ref 3.7–5.3)
Potassium: 3.7 mEq/L (ref 3.7–5.3)
Potassium: 4.6 mEq/L (ref 3.7–5.3)
Sodium: 137 mEq/L (ref 137–147)
Sodium: 143 mEq/L (ref 137–147)
Sodium: 144 mEq/L (ref 137–147)
Sodium: 143 mEq/L (ref 137–147)
TCO2: 19 mmol/L (ref 0–100)
TCO2: 17 mmol/L (ref 0–100)
TCO2: 18 mmol/L (ref 0–100)
TCO2: 17 mmol/L (ref 0–100)

## 2014-08-03 LAB — PREPARE FRESH FROZEN PLASMA
Unit division: 0
Unit division: 0

## 2014-08-03 LAB — URINE CULTURE
COLONY COUNT: NO GROWTH
Culture: NO GROWTH

## 2014-08-03 LAB — BASIC METABOLIC PANEL WITH GFR
Anion gap: 15 (ref 5–15)
BUN: 51 mg/dL — ABNORMAL HIGH (ref 6–23)
CO2: 17 meq/L — ABNORMAL LOW (ref 19–32)
Calcium: 6.3 mg/dL — CL (ref 8.4–10.5)
Chloride: 111 meq/L (ref 96–112)
Creatinine, Ser: 1.63 mg/dL — ABNORMAL HIGH (ref 0.50–1.35)
GFR calc Af Amer: 59 mL/min — ABNORMAL LOW
GFR calc non Af Amer: 51 mL/min — ABNORMAL LOW
Glucose, Bld: 101 mg/dL — ABNORMAL HIGH (ref 70–99)
Potassium: 4.2 meq/L (ref 3.7–5.3)
Sodium: 143 meq/L (ref 137–147)

## 2014-08-03 LAB — GLUCOSE, CAPILLARY
Glucose-Capillary: 120 mg/dL — ABNORMAL HIGH (ref 70–99)
Glucose-Capillary: 130 mg/dL — ABNORMAL HIGH (ref 70–99)

## 2014-08-03 LAB — VITAMIN D 1,25 DIHYDROXY
VITAMIN D2 1, 25 (OH): 13 pg/mL
Vitamin D 1, 25 (OH)2 Total: 46 pg/mL (ref 18–72)
Vitamin D3 1, 25 (OH)2: 33 pg/mL

## 2014-08-03 LAB — PREPARE RBC (CROSSMATCH)

## 2014-08-03 LAB — ANTI-DNA ANTIBODY, DOUBLE-STRANDED: ds DNA Ab: 12 IU/mL — ABNORMAL HIGH

## 2014-08-03 LAB — C4 COMPLEMENT: COMPLEMENT C4, BODY FLUID: 12 mg/dL — AB (ref 10–40)

## 2014-08-03 LAB — PROCALCITONIN: Procalcitonin: 4.58 ng/mL

## 2014-08-03 LAB — C3 COMPLEMENT: C3 Complement: 53 mg/dL — ABNORMAL LOW (ref 90–180)

## 2014-08-03 MED ORDER — ANTICOAGULANT SODIUM CITRATE 4% (200MG/5ML) IV SOLN
5.0000 mL | Freq: Once | Status: DC
  Filled 2014-08-03: qty 250

## 2014-08-03 MED ORDER — VITAL AF 1.2 CAL PO LIQD
1000.0000 mL | ORAL | Status: DC
  Administered 2014-08-03: 1000 mL
  Filled 2014-08-03 (×6): qty 1000

## 2014-08-03 MED ORDER — SODIUM CHLORIDE 0.9 % IV SOLN: Freq: Once | INTRAVENOUS | Status: DC

## 2014-08-03 MED ORDER — ACD FORMULA A 0.73-2.45-2.2 GM/100ML VI SOLN
500.0000 mL | Status: DC
  Administered 2014-08-02: 23:00:00 via INTRAVENOUS
  Filled 2014-08-03: qty 500

## 2014-08-03 MED ORDER — SODIUM CHLORIDE 0.9 % IV SOLN: 4.0000 g | Freq: Once | INTRAVENOUS | Status: DC

## 2014-08-03 MED ORDER — FENTANYL CITRATE 0.05 MG/ML IJ SOLN: 25.0000 ug | INTRAMUSCULAR | Status: DC | PRN

## 2014-08-03 MED ORDER — DIPHENHYDRAMINE HCL 25 MG PO CAPS: 25.0000 mg | ORAL_CAPSULE | Freq: Four times a day (QID) | ORAL | Status: DC | PRN

## 2014-08-03 MED ORDER — FUROSEMIDE 10 MG/ML IJ SOLN
40.0000 mg | Freq: Three times a day (TID) | INTRAMUSCULAR | Status: DC
  Administered 2014-08-03 – 2014-08-04 (×3): 40 mg via INTRAVENOUS
  Filled 2014-08-03 (×6): qty 4

## 2014-08-03 MED ORDER — SODIUM CHLORIDE 0.9 % IV SOLN
Freq: Once | INTRAVENOUS | Status: DC
  Administered 2014-08-03: 20:00:00 via INTRAVENOUS_CENTRAL
  Filled 2014-08-03: qty 200

## 2014-08-03 MED ORDER — ACETAMINOPHEN 325 MG PO TABS: 650.0000 mg | ORAL_TABLET | ORAL | Status: DC | PRN

## 2014-08-03 MED ORDER — SODIUM CHLORIDE 0.9 % IV SOLN
4.0000 g | Freq: Once | INTRAVENOUS | Status: AC
  Administered 2014-08-03: 4 g via INTRAVENOUS
  Filled 2014-08-03: qty 40

## 2014-08-03 MED ORDER — CALCIUM GLUCONATE 10 % IV SOLN
2.0000 g | Freq: Once | INTRAVENOUS | Status: AC
  Administered 2014-08-03: 2 g via INTRAVENOUS
  Filled 2014-08-03: qty 20

## 2014-08-03 MED ORDER — SODIUM CHLORIDE 0.9 % IV SOLN
INTRAVENOUS | Status: AC
  Administered 2014-08-03 (×4): via INTRAVENOUS_CENTRAL
  Filled 2014-08-03 (×10): qty 200

## 2014-08-03 MED ORDER — CALCIUM CARBONATE ANTACID 500 MG PO CHEW
2.0000 | CHEWABLE_TABLET | ORAL | Status: AC
  Filled 2014-08-03 (×2): qty 2

## 2014-08-03 MED ORDER — PROPOFOL 10 MG/ML IV EMUL
5.0000 ug/kg/min | INTRAVENOUS | Status: DC
  Administered 2014-08-04: 19.976 ug/kg/min via INTRAVENOUS
  Administered 2014-08-04 (×2): 20 ug/kg/min via INTRAVENOUS
  Administered 2014-08-05: 15 ug/kg/min via INTRAVENOUS
  Administered 2014-08-05: 45 ug/kg/min via INTRAVENOUS
  Filled 2014-08-03 (×6): qty 100

## 2014-08-03 MED ORDER — SODIUM CHLORIDE 0.9 % IV SOLN
1000.0000 mg | Freq: Once | INTRAVENOUS | Status: AC
  Administered 2014-08-03: 1000 mg via INTRAVENOUS
  Filled 2014-08-03: qty 100

## 2014-08-03 MED ORDER — VITAL HIGH PROTEIN PO LIQD: 1000.0000 mL | ORAL | Status: DC

## 2014-08-03 MED ORDER — POTASSIUM CHLORIDE 20 MEQ/15ML (10%) PO LIQD
20.0000 meq | Freq: Three times a day (TID) | ORAL | Status: DC
  Administered 2014-08-03 (×3): 20 meq
  Filled 2014-08-03 (×6): qty 15

## 2014-08-03 MED ORDER — SODIUM CHLORIDE 0.9 % IV SOLN
50.0000 ug/h | INTRAVENOUS | Status: DC
  Administered 2014-08-04 (×2): 125 ug/h via INTRAVENOUS
  Administered 2014-08-05: 200 ug/h via INTRAVENOUS
  Filled 2014-08-03 (×3): qty 50

## 2014-08-03 MED ORDER — VORICONAZOLE 200 MG IV SOLR
200.0000 mg | Freq: Two times a day (BID) | INTRAVENOUS | Status: DC
  Administered 2014-08-04 – 2014-08-05 (×4): 200 mg via INTRAVENOUS
  Filled 2014-08-03 (×6): qty 200

## 2014-08-03 MED ORDER — ACD FORMULA A 0.73-2.45-2.2 GM/100ML VI SOLN
Status: AC
  Administered 2014-08-03: 500 mL via INTRAVENOUS
  Filled 2014-08-03: qty 500

## 2014-08-03 MED ORDER — SODIUM CHLORIDE 0.9 % IV SOLN
1.0000 g | Freq: Once | INTRAVENOUS | Status: AC
  Administered 2014-08-03: 1 g via INTRAVENOUS
  Filled 2014-08-03 (×2): qty 10

## 2014-08-03 MED ORDER — ACD FORMULA A 0.73-2.45-2.2 GM/100ML VI SOLN
500.0000 mL | Status: DC
  Administered 2014-08-03: 500 mL via INTRAVENOUS
  Filled 2014-08-03: qty 500

## 2014-08-03 MED ORDER — ANTICOAGULANT SODIUM CITRATE 4% (200MG/5ML) IV SOLN
5.0000 mL | Freq: Once | Status: DC
  Filled 2014-08-03 (×2): qty 250

## 2014-08-03 NOTE — Progress Notes (Signed)
PULMONARY / CRITICAL CARE MEDICINE   Name: Thomas BoatmanDennis R Ackers MRN: [id number] DOB: Mar 19, 1971    ADMISSION DATE:  08/01/2014 CONSULTATION DATE:  08/03/2014  REFERRING MD :  Riley KillSwartz  CHIEF COMPLAINT:  Hemoptysis  INITIAL PRESENTATION: 43 y.o. M with hx SLE, HTN, CKD transferred to CIR on 10/7 from [facility] where he had recent prolonged hospitalization during which he was intubated twice (extubated 9/25) and had a 3 day course of HFOV, cyclophosphamide, rituximab and amicar.  In addition, he received 3 rounds of PLEX and 3 days of pulse dose steroids.  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   On evening of 10/7, pt reportedly had 2 episodes of scant hemoptysis.  Later, he began to have respiratory distress requiring NRB.  PCCM was consulted and upon arrival at bedside, pt noted to be in moderate respiratory distress.  He was transferred to the ICU and started on BiPAP.  Unfortunately, SpO2 began to drop and pt's work of breathing began to increase.  He subsequently required intubation.  STUDIES:    SIGNIFICANT EVENTS: 10/7 - transferred to CIR from [facility]. 10/7 - PCCM consulted for hemoptysis, pt developed acute hypoxemic respiratory failure which failed BiPAP and required intubation. 10/7 - Bronched at bedside>> acute inflammation, thin bloody secretions, no focal source bleeding, sequential BAL became progressively bloodier 08/02/14: [facility]. Got TPE (plasma exchange) ; had HD cath placed   SUBJECTIVE/OVERNIGHT/INTERVAL HX 08/03/14: [facility]. Got TPE last night. Plan for another TPE today. False low hgb and PRBC ordered but later canceled. Due for rituxan 1000mg  today per transfer notes; unable to confirm this on [facility] epic through care every where access. He is 4L positive. Denies complaints. Prefers to remain intubated. Echo wish systolic CHF ef 45%     VITAL SIGNS: Temp:  [97.4 F  (36.3 C)-97.8 F (36.6 C)] 97.4 F (36.3 C) (10/09 0807) Pulse Rate:  [46-88] 65 (10/09 0807) Resp:  [18-24] 18 (10/09 0807) BP: (96-165)/(67-87) 165/82 mmHg (10/09 0807) SpO2:  [69 %-100 %] 100 % (10/09 0807) Arterial Line BP: (135-168)/(70-96) 162/82 mmHg (10/09 0700) FiO2 (%):  [40 %] 40 % (10/09 0807) Weight:  [79.379 kg (175 lb)-82.555 kg (182 lb)] 82.555 kg (182 lb) (10/09 0300) HEMODYNAMICS:   VENTILATOR SETTINGS: Vent Mode:  [-] PSV FiO2 (%):  [40 %] 40 % Set Rate:  [22 bmp] 22 bmp Vt Set:  [650 mL] 650 mL PEEP:  [8 cmH20] 8 cmH20 Pressure Support:  [10 cmH20] 10 cmH20 Plateau Pressure:  [30 cmH20-33 cmH20] 31 cmH20 INTAKE / OUTPUT: Intake/Output     10/08 0701 - 10/09 0700 10/09 0701 - 10/10 0700   I.V. (mL/kg) 3152.4 (38.2)    NG/GT 330    IV Piggyback 1070    Total Intake(mL/kg) 4552.4 (55.1)    Urine (mL/kg/hr) 1290 (0.7) 110 (0.5)   Total Output 1290 110   Net +3262.4 -110          PHYSICAL EXAMINATION: General: WDWN young male, NAD on vent Neuro: sedated, RASS 0, nods appropriately, MAE HEENT: /AT. PERRL, sclerae anicteric. Cardiovascular: RRR, no M/R/G.  Lungs: resps even non labored on full vent support, few scattered rhonchi Abdomen: BS x 4, soft, NT/ND.  Musculoskeletal: No gross deformities, no edema.  Skin: Intact, warm, no rashes.  LABS: PULMONARY  Recent Labs Lab 08/01/14 2217 08/02/14 0353 08/02/14 2205 08/02/14 2321 08/03/14 0352  PHART  7.437 7.394  --   --   --   PCO2ART 29.7* 35.0  --   --   --   PO2ART 52.0* 303.0*  --   --   --   HCO3 19.7* 20.9  --   --   --   TCO2 20.6 22.0 19 17 18   O2SAT 85.2 100.0  --   --   --     CBC  Recent Labs Lab 08/01/14 0620 08/02/14 0500  08/02/14 2321 08/03/14 0345 08/03/14 0352  HGB 9.8* 10.1*  < > 7.8* 7.4* 6.1*  HCT 29.5* 30.7*  < > 23.0* 22.9* 18.0*  WBC 4.0 7.2  --   --  1.3*  --   PLT 71* 107*  --   --  57*  --   < > = values in this interval not  displayed.  COAGULATION  Recent Labs Lab 08/02/14 1720  INR 1.38    CARDIAC  No results found for this basename: TROPONINI,  in the last 168 hours No results found for this basename: PROBNP,  in the last 168 hours   CHEMISTRY  Recent Labs Lab 08/01/14 0620 08/02/14 0500 08/02/14 2205 08/02/14 2321 08/03/14 0345 08/03/14 0352  NA 138 136* 137 143 143 144  K 4.9 4.9 4.6 4.3 4.2 3.7  CL 105 102 108 110 111 114*  CO2 24 22  --   --  17*  --   GLUCOSE 89 209* 108* 109* 101* 93  BUN 60* 62* 57* 53* 51* 44*  CREATININE 1.91* 1.94* 2.10* 1.80* 1.63* 1.60*  CALCIUM 6.3* 6.0*  --   --  6.3*  --   MG  --  1.9  --   --   --   --   PHOS  --  3.2  --   --   --   --    Estimated Creatinine Clearance: 62.9 ml/min (by C-G formula based on Cr of 1.6).   LIVER  Recent Labs Lab 08/01/14 0620 08/02/14 1720  AST 151*  --   ALT 165*  --   ALKPHOS 47  --   BILITOT 0.3  --   PROT 4.8*  --   ALBUMIN 2.0*  --   INR  --  1.38     INFECTIOUS  Recent Labs Lab 08/02/14 1125 08/03/14 0345  LATICACIDVEN 0.8  --   PROCALCITON 4.10 4.58     ENDOCRINE CBG (last 3)   Recent Labs  08/01/14 1155 08/01/14 1605 08/01/14 2110  GLUCAP 118* 161* 165*         IMAGING x48h Dg Chest Port 1 View  08/02/2014   CLINICAL DATA:  Past catheter placement  EXAM: PORTABLE CHEST - 1 VIEW  COMPARISON:  08/02/2014  FINDINGS: Right jugular central venous catheter placement with the tip projecting over the SVC. Left IJ central venous catheter with the tip projecting over the cavoatrial junction. Endotracheal tube with the tip 2.2 cm above the carina.  There is stable cardiomegaly. There is are patchy bilateral interstitial and alveolar airspace opacities. There are bilateral small pleural effusions. There is no pneumothorax.  Unremarkable osseous structures.  IMPRESSION: 1. Right jugular central venous catheter with the tip projecting over the SVC. 2. Left IJ central venous catheter with the  tip projecting over the cavoatrial junction. 3. Endotracheal tube with the tip 2.2 cm above the carina. 4. Overall findings most consistent with pulmonary edema.   Electronically Signed   By: Elige Ko  On: 08/02/2014 21:58   Dg Chest Port 1 View  08/02/2014   CLINICAL DATA:  Followup for bilateral lung opacities  EXAM: PORTABLE CHEST - 1 VIEW  COMPARISON:  08/02/2014  FINDINGS: No change in central line, NG tube, or endotracheal tube. Moderate cardiac enlargement. Moderately severe bilateral alveolar densities throughout both lungs, mildly improved when compared to prior study.  IMPRESSION: Improved but persistent and significant alveolar opacities.   Electronically Signed   By: Esperanza Heir M.D.   On: 08/02/2014 07:50   Dg Chest Port 1 View  08/02/2014   CLINICAL DATA:  Central line placement.  Initial encounter  EXAM: PORTABLE CHEST - 1 VIEW  COMPARISON:  One-view chest from the same day scratch the one-view chest 08/01/2014.  FINDINGS: The heart is enlarged. Diffuse interstitial and airspace disease is again noted. Patient has now been intubated. The endotracheal tube terminates 3.2 cm above the carina. A left IJ line is in place. The is 3.5 vertebral body segments below the carina unlikely within the right ventricle. A could be pulled back 4 cm for more optimal positioning.  IMPRESSION: 1. Stable diffuse interstitial and airspace disease. 2. Interval intubation. The endotracheal tube terminates 3.2 cm above the carina. 3. Placement of left IJ line. There is no pneumothorax. The tip of the catheter is likely within the right atrium. A could be pulled back 4 cm for more optimal positioning.  Critical Value/emergent results were called by telephone at the time of interpretation on 08/02/2014 at 12:20 am to Roselie Awkward, RN , who verbally acknowledged these results.   Electronically Signed   By: Gennette Pac M.D.   On: 08/02/2014 00:20      ASSESSMENT / PLAN:  PULMONARY OETT 10/7  >>> A: Acute hypoxemic respiratory failure with Hemoptysis in setting of Recent refractory lupus pneumonitis   - due to    -  DAH - sequential BAL performed at bedside supportive of DAH (see image in bronch note).   - other etiologies : acute +/- chronic systolic CHF (confirmed echo 08/02/14)     :  infectious (see ID)     - doing SBT 08/03/14 but cxr too wet for extubation  P:   Full mechanical support, wean as able. VAP bundle. DuoNebs / Albuterol. 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   - [facility] 08/02/14 WITH ACUTE +/- CHRONIC systolic chf ef 45%  P:  Start aggressive diuresis 08/03/14 Hold outpatient norvasc, coreg, cardura, apresoline.  RENAL A:   CKD Baseline Cr when he left [facility] on 10/6 was 2 Lupus nephritis   - improving creat P:   NS @ 100. -> chagne to [facility] in setting of chf F/u chem   GASTROINTESTINAL A:   GI prophylaxis Nutrition P:   SUP: Pantoprazole. Start TF 10/9  HEMATOLOGIC A:   Anemia of chronic disease +/- acute blood loss Thrombocytopenia VTE Prophylaxis    - false low hgb 6's and recheck was 7./1gm%  P:  Transfuse per usual ICU guidelines; when hgb < 7gm% SCD's only. F/u cbc   INFECTIOUS BCx2 10/7 >>> UCx 10/7 >>> Sputum Cx 10/7 >>> RVP 10/7 >>> PCP Stain 10/7 >>> Fungal Stain 10/7 >>> CDiff 10/7>>>NEG Aspergillus galactomannan Ag 10/7 >>> A:   Recent aspergillus in sputum culture from 10/1 PCP prophylaxis HCAP?    - PCT not inconsistent with focal infections  P:   Abx: Mepron, start date 10/8,  Abx: Voriconazole, start date 10/8, Abx: Vanc start date 10/8 Abx: Imipenem start date 10/8   ENDOCRINE A:   At risk relative A.I. - has been on slow high dose prednisone taper. P:   Solumedrol. CBG's q4hr. SSI.  NEUROLOGIC A:   Acute metabolic encephalopathy at admit to ICU 08/01/2014   - normal WUA 08/03/14 P:   dC :  Propofol gtt / Fentanyl  gtt. Start fentanyl prn RASS goal: 0 to -1. Daily WUA.  RHEUMATOLOGIC A: SLE - suspect flare P: S/p TPE 08/02/14 and repeat 08/03/14 Pulse steroids - solumedrol 125mg  q6hrs x 3 days from 08/02/14, then start taper. Continue plaquenil. Per CIR note, next dose Rituxan 1000mg  due Friday 10/9 (TODAY); pharmacy working on conifrming this and getting patient on this Per CIR tx note,  next dose Cytoxan 450mg  on Saturday 10/17 Await  dS DNA, C3 and C4 complement from 08/02/14 - pending    TODAY'S  STAFF MD SUMMARY: 43 y.o. M with SLE, just [facility] CIR on 10[facility]7 from [facility]  Developed respiratory distress + scant hemoptysis while on CIR evening of 10/7.  Required transfer to ICU and intubation.  Bedside bronch done, sequential BAL suggestive of DAH.  Pan cultures pending, empiric abx for HCAP (doubt).   Needs tx back to [facility] -  (beds full as of 10 am and no call back from [facility]).  S/p PLEX 08/02/14 and repeat 08/03/14. Pharmacy working on Rituxan for today   Family updated at bedside by NP    The patient is critically ill with multiple organ systems failure and requires high complexity decision making for assessment and support, frequent evaluation and titration of therapies, application of advanced monitoring technologies and extensive interpretation of multiple databases.   Critical Care Time devoted to patient care services described in this note is  35  Minutes.  Dr. Kalman ShanMurali Zyah Gomm, M.D., [facility].C.P Pulmonary and Critical Care Medicine Staff Physician [place] [facility] [place] Pulmonary and Critical Care Pager: [phone], If no answer or between  15:00h - 7:00h: call [phone]  08/03/2014 9:41 AM

## 2014-08-03 NOTE — Progress Notes (Signed)
S:intubated O:BP 163/81  Pulse 67  Temp(Src) 97.4 F (36.3 C) (Oral)  Resp 18  Ht 5' 10.08" (1.78 m)  Wt 82.555 kg (182 lb)  BMI 26.06 kg/m2  SpO2 100%  Intake/Output Summary (Last 24 hours) at 08/03/14 1153 Last data filed at 08/03/14 0900  Gross per 24 hour  Intake 3604.22 ml  Output   1245 ml  Net 2359.22 ml   Weight change: -2.621 kg (-5 lb 12.4 oz) NWG:NFAOZGen:Awake and alert, intubated CVS:RRR Resp:bil crackles Abd:+ BS NTND Ext: 1-2+ edema NEURO:Follows commands.  Able to respond to ? By writing   . sodium chloride   Intravenous Once  . antiseptic oral rinse  7 mL Mouth Rinse QID  . atovaquone  1,500 mg Per Tube Q breakfast  . chlorhexidine  15 mL Mouth Rinse BID  . furosemide  40 mg Intravenous 3 times per day  . hydroxychloroquine  400 mg Per Tube Daily  . imipenem-cilastatin  500 mg Intravenous 3 times per day  . ipratropium-albuterol  3 mL Nebulization Q6H  . levothyroxine  75 mcg Intravenous Daily  . methylPREDNISolone (SOLU-MEDROL) injection  125 mg Intravenous Q6H  . pantoprazole (PROTONIX) IV  40 mg Intravenous Daily  . potassium chloride  20 mEq Per Tube TID  . riTUXimab (RITUXAN) IV infusion  1,000 mg Intravenous Once  . sodium chloride  3 mL Intravenous Q12H  . vancomycin  750 mg Intravenous Q12H  . voriconazole  200 mg Intravenous Q12H   Dg Chest Portable 1 View  08/03/2014   CLINICAL DATA:  Respiratory failure.  EXAM: PORTABLE CHEST - 1 VIEW  COMPARISON:  August 02, 2014.  FINDINGS: Stable cardiomediastinal silhouette. Endotracheal tube is in grossly good position with distal tip 2.5 cm above the carina. Right internal jugular venous sheath is unchanged in position. Left internal jugular catheter line is unchanged in position. Nasogastric tube is seen entering the stomach. No pneumothorax or significant pleural effusion is noted. Stable bilateral lung opacities are noted most consistent with pulmonary edema or possibly diffuse pneumonia. Bony thorax is intact.   IMPRESSION: Stable support apparatus. Stable bilateral lung opacities concerning for edema or pneumonia.   Electronically Signed   By: Roque LiasJames  Green M.D.   On: 08/03/2014 08:03   Dg Chest Port 1 View  08/02/2014   CLINICAL DATA:  Past catheter placement  EXAM: PORTABLE CHEST - 1 VIEW  COMPARISON:  08/02/2014  FINDINGS: Right jugular central venous catheter placement with the tip projecting over the SVC. Left IJ central venous catheter with the tip projecting over the cavoatrial junction. Endotracheal tube with the tip 2.2 cm above the carina.  There is stable cardiomegaly. There is are patchy bilateral interstitial and alveolar airspace opacities. There are bilateral small pleural effusions. There is no pneumothorax.  Unremarkable osseous structures.  IMPRESSION: 1. Right jugular central venous catheter with the tip projecting over the SVC. 2. Left IJ central venous catheter with the tip projecting over the cavoatrial junction. 3. Endotracheal tube with the tip 2.2 cm above the carina. 4. Overall findings most consistent with pulmonary edema.   Electronically Signed   By: Elige KoHetal  Patel   On: 08/02/2014 21:58   Dg Chest Port 1 View  08/02/2014   CLINICAL DATA:  Followup for bilateral lung opacities  EXAM: PORTABLE CHEST - 1 VIEW  COMPARISON:  08/02/2014  FINDINGS: No change in central line, NG tube, or endotracheal tube. Moderate cardiac enlargement. Moderately severe bilateral alveolar densities throughout both lungs, mildly improved when  compared to prior study.  IMPRESSION: Improved but persistent and significant alveolar opacities.   Electronically Signed   By: Esperanza Heiraymond  Rubner M.D.   On: 08/02/2014 07:50   Dg Chest Port 1 View  08/02/2014   CLINICAL DATA:  Central line placement.  Initial encounter  EXAM: PORTABLE CHEST - 1 VIEW  COMPARISON:  One-view chest from the same day scratch the one-view chest 08/01/2014.  FINDINGS: The heart is enlarged. Diffuse interstitial and airspace disease is again noted.  Patient has now been intubated. The endotracheal tube terminates 3.2 cm above the carina. A left IJ line is in place. The is 3.5 vertebral body segments below the carina unlikely within the right ventricle. A could be pulled back 4 cm for more optimal positioning.  IMPRESSION: 1. Stable diffuse interstitial and airspace disease. 2. Interval intubation. The endotracheal tube terminates 3.2 cm above the carina. 3. Placement of left IJ line. There is no pneumothorax. The tip of the catheter is likely within the right atrium. A could be pulled back 4 cm for more optimal positioning.  Critical Value/emergent results were called by telephone at the time of interpretation on 08/02/2014 at 12:20 am to Roselie AwkwardShannon Love, RN , who verbally acknowledged these results.   Electronically Signed   By: Gennette Pachris  Mattern M.D.   On: 08/02/2014 00:20   Dg Chest Port 1 View  08/01/2014   CLINICAL DATA:  Respiratory distress. Coughing up blood. Initial encounter.  EXAM: PORTABLE CHEST - 1 VIEW  COMPARISON:  None.  FINDINGS: Extensive bilateral airspace disease is present. Heart size is enlarged. There is no pneumothorax or pleural effusion.  IMPRESSION: Extensive bilateral airspace disease identified and has appearance most compatible with pneumonia rather than edema.   Electronically Signed   By: Drusilla Kannerhomas  Dalessio M.D.   On: 08/01/2014 22:50   BMET    Component Value Date/Time   NA 144 08/03/2014 0352   K 3.7 08/03/2014 0352   CL 114* 08/03/2014 0352   CO2 17* 08/03/2014 0345   GLUCOSE 93 08/03/2014 0352   BUN 44* 08/03/2014 0352   CREATININE 1.60* 08/03/2014 0352   CALCIUM 6.3* 08/03/2014 0345   GFRNONAA 51* 08/03/2014 0345   GFRAA 59* 08/03/2014 0345   CBC    Component Value Date/Time   WBC 1.3* 08/03/2014 0345   RBC 2.74* 08/03/2014 0345   HGB 6.1* 08/03/2014 0352   HCT 18.0* 08/03/2014 0352   PLT 57* 08/03/2014 0345   MCV 83.6 08/03/2014 0345   MCH 27.0 08/03/2014 0345   MCHC 32.3 08/03/2014 0345   RDW 18.2* 08/03/2014 0345    LYMPHSABS 0.1* 08/01/2014 0620   MONOABS 0.1 08/01/2014 0620   EOSABS 0.0 08/01/2014 0620   BASOSABS 0.0 08/01/2014 0620     Assessment: 1.  SLE with DAH 2. CKD stable 3. HTN 4. Hypocalcemia. Received another gram of  IV Ca earlier today  Plan: 1. [facility]  If there cont to be a delay then will do another plasmaphersis here tonight   Tippi Mccrae T

## 2014-08-03 NOTE — Progress Notes (Signed)
CRITICAL VALUE ALERT  Critical value received:  Ca 6.3  Date of notification:  08/03/14  Time of notification:  0516  Critical value read back:Yes.    Nurse who received alert:  Roselie AwkwardShannon Subrena Devereux  MD notified (1st page):  Dr. Marisue HumbleSanford previously notified for istat calcium low level; orders for iv calcium to be given

## 2014-08-03 NOTE — Progress Notes (Signed)
eLink Physician-Brief Progress Note Patient Name: Thomas BoatmanDennis R Janssens DOB: 1971-08-30 MRN: [id number]   Date of Service  08/03/2014  HPI/Events of Note  Hgb down to 6.1 from 7.8  eICU Interventions  Plan: Transfuse 1 unit of pRBC Post-transfusion CBC        [name] 08/03/2014, 4:13 AM

## 2014-08-03 NOTE — Progress Notes (Signed)
CRITICAL VALUE ALERT  Critical value received:  WBC 1.3  Date of notification:  08/03/14  Time of notification:  0457  Critical value read back:Yes.    Nurse who received alert:  Roselie AwkwardShannon Efraim Vanallen   MD notified (1st page):  Dr. Darrick Pennaeterding  Time of first page:  0500  MD notified (2nd page):  Time of second page:  Responding MD:  Dr. Darrick Pennaeterding  Time MD responded:  0500

## 2014-08-03 NOTE — Progress Notes (Signed)
Dr. Marisue HumbleSanford notified of ionized calcium 0.91 following 2g calcium gluconate given after plasma exchange.  Orders received.    Roselie AwkwardShannon Ansley Mangiapane, RN

## 2014-08-03 NOTE — IPOC Note (Signed)
Overall Plan of Care [facility](IPOC) Patient Details Name: Thomas BoatmanDennis R Andrews MRN: [id number] DOB: 01-15-1971  Admitting Diagnosis: hx of lupus deconditioning resp failure  Hospital Problems: Principal Problem:   Debility Active Problems:   Lupus nephritis   Diffuse pulmonary alveolar hemorrhage   Acute blood loss anemia   Thrombocytopenia   Hypocalcemia   Pulmonary aspergillosis   Acute on chronic renal failure     Functional Problem List: Nursing Bladder;Bowel;Edema;Endurance;Medication Management;Motor  PT Balance;Edema;Endurance;Motor;Safety  OT Balance;Edema;Endurance;Motor  SLP    TR         Basic ADL's: OT Grooming;Bathing;Dressing;Toileting     Advanced  ADL's: OT Simple Meal Preparation;Light Housekeeping     Transfers: PT Bed Mobility;Bed to Chair;Car;Furniture  OT Toilet;Tub/Shower     Locomotion: PT Ambulation;Wheelchair Mobility;Stairs     Additional Impairments: OT Fuctional Use of Upper Extremity  SLP        TR      Anticipated Outcomes Item Anticipated Outcome  Self Feeding    Swallowing      Basic self-care  Mod I with UB and Min assist with LB and use of AE  Toileting  Min assist   Bathroom Transfers Supervision with toilet transfer and Min assist with tub bench transfer  Bowel/Bladder  Mod I  Transfers  Mod I  Locomotion  Mod I  Communication     Cognition     Pain  < 3  Safety/Judgment  Mod I   Therapy Plan: PT Intensity: Minimum of 1-2 x/day ,45 to 90 minutes PT Frequency: 5 out of 7 days PT Duration Estimated Length of Stay: 14 to 17 days OT Intensity: Minimum of 1-2 x/day, 45 to 90 minutes OT Frequency: 5 out of 7 days OT Duration/Estimated Length of Stay: 14-17 days         Team Interventions: Nursing Interventions Patient/Family Education;Bladder Management;Bowel Management;Disease Management/Prevention;Medication Management;Psychosocial Support  PT interventions Ambulation/gait training;Discharge planning;Functional  mobility training;Psychosocial support;Therapeutic Activities;Wheelchair propulsion/positioning;Therapeutic Exercise;Neuromuscular re-education;Disease management/prevention;Balance/vestibular training;Cognitive remediation/compensation;DME/adaptive equipment instruction;Splinting/orthotics;UE/LE Strength taining/ROM;UE/LE Coordination activities;Stair training;Patient/family education;Functional electrical stimulation;Community reintegration  OT Interventions Balance/vestibular training;Community reintegration;DME/adaptive equipment instruction;Psychosocial support;UE/LE Strength taining/ROM;Wheelchair propulsion/positioning;Functional mobility training;Discharge planning;Patient/family education;Self Care/advanced ADL retraining;Therapeutic Activities;Therapeutic Exercise  SLP Interventions    TR Interventions    SW/CM Interventions Discharge Planning;Psychosocial Support;Patient/Family Education    Team Discharge Planning: Destination: PT-Home (mother's home) ,OT- Home (mother's home) , SLP-  Projected Follow-up: PT-Home health PT, OT-  Home health OT, SLP-  Projected Equipment Needs: PT-To be determined;Wheelchair (measurements);Wheelchair cushion (measurements);Rolling walker with 5" wheels, OT- To be determined;Tub/shower seat;3 in 1 bedside comode, SLP-  Equipment Details: PT-tbd, OT-DME needs to be further assessed Patient/family involved in discharge planning: PT- Patient,  OT-Patient, SLP-   MD ELOS: to be determined Medical Rehab Prognosis:  Guarded Assessment: This patient was transferred off acutely on rehab day #2 due to medical complications. Re-assessment to take place at a later date when the patient is appropriate medically and back on the rehab unit.    See Team Conference Notes for weekly updates to the plan of care

## 2014-08-03 NOTE — Progress Notes (Signed)
INITIAL NUTRITION ASSESSMENT  DOCUMENTATION CODES Per approved criteria  -Not Applicable   INTERVENTION: Initiate Vital AF 1.2 formula at 20 ml/hr and increase by 10 ml every 4 hours to goal rate of 70 ml/hr to provide 2016 kcals, 126 gm protein, 1362 ml of free water RD to follow for nutrition care plan  NUTRITION DIAGNOSIS: Inadequate oral intake related to inability to eat as evidenced by NPO status  Goal: Pt to meet >/= 90% of their estimated nutrition needs   Monitor:  TF regimen & tolerance, respiratory status, weight, labs, I/O's  Reason for Assessment: Consult  43 y.o. male  Admitting Dx: hemoptysis  ASSESSMENT: 43 y.o. [facility] where he had recent hospitalization which included intubation x 2 and HFOV x [facility] days [facility] On evening of 10/7, pt reportedly had 2 episodes of scant hemoptysis. Later, he began to have respiratory distress requiring NRB. He was transferred to the ICU and started on BiPAP. Unfortunately, SpO2 began to drop and pt's work of breathing began to increase. He subsequently required intubation.  Patient is currently intubated on ventilator support -- OGT in place MV: 11.1 L/min Temp (24hrs), Avg:97.5 F (36.4 C), Min:97.4 F (36.3 C), Max:97.8 F (36.6 C)   No nutrition problems identified PTA.  No muscle or subcutaneous fat depletion noticed.  RD consulted for TF initiation & management.  Height: Ht Readings from Last 1 Encounters:  08/01/14 5' 10.08" (1.78 m)    Weight: Wt Readings from Last 1 Encounters:  08/03/14 182 lb (82.555 kg)    Ideal Body Weight: 166 lb  % Ideal Body Weight: 110%  Wt Readings from Last 10 Encounters:  08/03/14 182 lb (82.555 kg)  08/01/14 180 lb 1.9 oz (81.7 kg)  07/26/14 180 lb (81.647 kg)    Usual Body Weight: unable to obtain  % Usual Body Weight: ---  BMI:  Body mass index is 26.06 kg/(m^2).  Estimated Nutritional Needs: Kcal: 1900-2100 Protein: 125-135  gm Fluid: per MD  Skin: left back incision   Diet Order: NPO  EDUCATION NEEDS: -No education needs identified at this time   Intake/Output Summary (Last 24 hours) at 08/03/14 1135 Last data filed at 08/03/14 0900  Gross per 24 hour  Intake 3604.22 ml  Output   1245 ml  Net 2359.22 ml    Labs:   Recent Labs Lab 08/01/14 0620 08/02/14 0500  08/02/14 2321 08/03/14 0345 08/03/14 0352  NA 138 136*  < > 143 143 144  K 4.9 4.9  < > 4.3 4.2 3.7  CL 105 102  < > 110 111 114*  CO2 24 22  --   --  17*  --   BUN 60* 62*  < > 53* 51* 44*  CREATININE 1.91* 1.94*  < > 1.80* 1.63* 1.60*  CALCIUM 6.3* 6.0*  --   --  6.3*  --   MG  --  1.9  --   --   --   --   PHOS  --  3.2  --   --   --   --   GLUCOSE 89 209*  < > 109* 101* 93  < > = values in this interval not displayed.  CBG (last 3)   Recent Labs  08/01/14 1155 08/01/14 1605 08/01/14 2110  GLUCAP 118* 161* 165*    Scheduled Meds: . sodium chloride   Intravenous Once  . antiseptic oral rinse  7 mL Mouth Rinse QID  .  atovaquone  1,500 mg Per Tube Q breakfast  . chlorhexidine  15 mL Mouth Rinse BID  . furosemide  40 mg Intravenous 3 times per day  . hydroxychloroquine  400 mg Per Tube Daily  . imipenem-cilastatin  500 mg Intravenous 3 times per day  . ipratropium-albuterol  3 mL Nebulization Q6H  . levothyroxine  75 mcg Intravenous Daily  . methylPREDNISolone (SOLU-MEDROL) injection  125 mg Intravenous Q6H  . pantoprazole (PROTONIX) IV  40 mg Intravenous Daily  . potassium chloride  20 mEq Per Tube TID  . riTUXimab (RITUXAN) IV infusion  1,000 mg Intravenous Once  . sodium chloride  3 mL Intravenous Q12H  . vancomycin  750 mg Intravenous Q12H  . voriconazole  200 mg Intravenous Q12H    Continuous Infusions: . sodium chloride 100 mL/hr at 08/03/14 [id number]    Past Medical History  Diagnosis Date  . Systemic lupus erythematosus     with pulmonary, renal, cardiac, skin and joint involvement  . HTN (hypertension)    . CKD (chronic kidney disease)   . 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 Cholecystectomy      Maureen ChattersKatie Dinesh Ulysse, RD, LDN Pager #: [phone] After-Hours Pager #: [phone]

## 2014-08-04 ENCOUNTER — Inpatient Hospital Stay (HOSPITAL_COMMUNITY): Payer: BC Managed Care – PPO

## 2014-08-04 DIAGNOSIS — I5041 Acute combined systolic (congestive) and diastolic (congestive) heart failure: Secondary | ICD-10-CM

## 2014-08-04 LAB — PREPARE FRESH FROZEN PLASMA
UNIT DIVISION: 0
Unit division: 0

## 2014-08-04 LAB — POCT I-STAT, CHEM 8
BUN: 48 mg/dL — AB (ref 6–23)
CREATININE: 1.8 mg/dL — AB (ref 0.50–1.35)
Calcium, Ion: 0.95 mmol/L — ABNORMAL LOW (ref 1.12–1.23)
Chloride: 117 mEq/L — ABNORMAL HIGH (ref 96–112)
Glucose, Bld: 216 mg/dL — ABNORMAL HIGH (ref 70–99)
HCT: 20 % — ABNORMAL LOW (ref 39.0–52.0)
HEMOGLOBIN: 6.8 g/dL — AB (ref 13.0–17.0)
POTASSIUM: 4.8 meq/L (ref 3.7–5.3)
Sodium: 145 mEq/L (ref 137–147)
TCO2: 19 mmol/L (ref 0–100)

## 2014-08-04 LAB — CULTURE, BAL-QUANTITATIVE
CULTURE: NO GROWTH
Colony Count: NO GROWTH
GRAM STAIN: NONE SEEN

## 2014-08-04 LAB — CBC WITH DIFFERENTIAL/PLATELET
BASOS PCT: 0 % (ref 0–1)
Basophils Absolute: 0 10*3/uL (ref 0.0–0.1)
EOS ABS: 0 10*3/uL (ref 0.0–0.7)
EOS PCT: 0 % (ref 0–5)
HEMATOCRIT: 21.7 % — AB (ref 39.0–52.0)
HEMOGLOBIN: 7.1 g/dL — AB (ref 13.0–17.0)
LYMPHS ABS: 0 10*3/uL — AB (ref 0.7–4.0)
Lymphocytes Relative: 3 % — ABNORMAL LOW (ref 12–46)
MCH: 27 pg (ref 26.0–34.0)
MCHC: 32.7 g/dL (ref 30.0–36.0)
MCV: 82.5 fL (ref 78.0–100.0)
MONO ABS: 0.1 10*3/uL (ref 0.1–1.0)
MONOS PCT: 3 % (ref 3–12)
Neutro Abs: 1.5 10*3/uL — ABNORMAL LOW (ref 1.7–7.7)
Neutrophils Relative %: 94 % — ABNORMAL HIGH (ref 43–77)
Platelets: 49 10*3/uL — ABNORMAL LOW (ref 150–400)
RBC: 2.63 MIL/uL — AB (ref 4.22–5.81)
RDW: 18.4 % — ABNORMAL HIGH (ref 11.5–15.5)
WBC: 1.6 10*3/uL — ABNORMAL LOW (ref 4.0–10.5)

## 2014-08-04 LAB — BASIC METABOLIC PANEL
Anion gap: 16 — ABNORMAL HIGH (ref 5–15)
BUN: 51 mg/dL — AB (ref 6–23)
CALCIUM: 6.8 mg/dL — AB (ref 8.4–10.5)
CO2: 17 mEq/L — ABNORMAL LOW (ref 19–32)
CREATININE: 1.51 mg/dL — AB (ref 0.50–1.35)
Chloride: 113 mEq/L — ABNORMAL HIGH (ref 96–112)
GFR, EST AFRICAN AMERICAN: 65 mL/min — AB (ref >90)
GFR, EST NON AFRICAN AMERICAN: 56 mL/min — AB (ref >90)
GLUCOSE: 167 mg/dL — AB (ref 70–99)
Potassium: 4.3 mEq/L (ref 3.7–5.3)
Sodium: 146 mEq/L (ref 137–147)

## 2014-08-04 LAB — PRO B NATRIURETIC PEPTIDE: Pro B Natriuretic peptide (BNP): 5300 pg/mL — ABNORMAL HIGH (ref 0–125)

## 2014-08-04 LAB — GLUCOSE, CAPILLARY
Glucose-Capillary: 126 mg/dL — ABNORMAL HIGH (ref 70–99)
Glucose-Capillary: 155 mg/dL — ABNORMAL HIGH (ref 70–99)
Glucose-Capillary: 160 mg/dL — ABNORMAL HIGH (ref 70–99)
Glucose-Capillary: 177 mg/dL — ABNORMAL HIGH (ref 70–99)
Glucose-Capillary: 185 mg/dL — ABNORMAL HIGH (ref 70–99)
Glucose-Capillary: 187 mg/dL — ABNORMAL HIGH (ref 70–99)
Glucose-Capillary: 191 mg/dL — ABNORMAL HIGH (ref 70–99)

## 2014-08-04 LAB — CULTURE, BAL-QUANTITATIVE W GRAM STAIN

## 2014-08-04 LAB — TROPONIN I: Troponin I: <0.3 ng/mL (ref <0.30)

## 2014-08-04 LAB — MAGNESIUM: Magnesium: 1.6 mg/dL (ref 1.5–2.5)

## 2014-08-04 LAB — PHOSPHORUS: Phosphorus: 4.3 mg/dL (ref 2.3–4.6)

## 2014-08-04 LAB — PROCALCITONIN: Procalcitonin: 3.25 ng/mL

## 2014-08-04 MED ORDER — ACD FORMULA A 0.73-2.45-2.2 GM/100ML VI SOLN
500.0000 mL | Status: DC
  Administered 2014-08-04: 500 mL via INTRAVENOUS
  Filled 2014-08-04: qty 500

## 2014-08-04 MED ORDER — DIPHENHYDRAMINE HCL 25 MG PO CAPS: 25.0000 mg | ORAL_CAPSULE | Freq: Four times a day (QID) | ORAL | Status: DC | PRN

## 2014-08-04 MED ORDER — FUROSEMIDE 10 MG/ML IJ SOLN
40.0000 mg | Freq: Four times a day (QID) | INTRAMUSCULAR | Status: AC
  Administered 2014-08-04 (×3): 40 mg via INTRAVENOUS
  Filled 2014-08-04: qty 4

## 2014-08-04 MED ORDER — PANTOPRAZOLE SODIUM 40 MG PO PACK
40.0000 mg | PACK | Freq: Every day | ORAL | Status: DC
  Administered 2014-08-05: 40 mg
  Filled 2014-08-04: qty 20

## 2014-08-04 MED ORDER — SODIUM CHLORIDE 0.9 % IJ SOLN
10.0000 mL | Freq: Two times a day (BID) | INTRAMUSCULAR | Status: DC
  Administered 2014-08-04: 20 mL
  Administered 2014-08-04 – 2014-08-05 (×2): 10 mL

## 2014-08-04 MED ORDER — POTASSIUM CHLORIDE 20 MEQ/15ML (10%) PO LIQD
20.0000 meq | Freq: Two times a day (BID) | ORAL | Status: DC
  Administered 2014-08-04: 20 meq

## 2014-08-04 MED ORDER — SODIUM CHLORIDE 0.9 % IJ SOLN: 10.0000 mL | INTRAMUSCULAR | Status: DC | PRN

## 2014-08-04 MED ORDER — SODIUM CHLORIDE 0.9 % IV SOLN: Freq: Once | INTRAVENOUS | Status: DC

## 2014-08-04 MED ORDER — ACD FORMULA A 0.73-2.45-2.2 GM/100ML VI SOLN
Status: AC
  Administered 2014-08-04: 500 mL via INTRAVENOUS
  Filled 2014-08-04: qty 1000

## 2014-08-04 MED ORDER — SODIUM CHLORIDE 0.9 % IV SOLN
2.0000 g | Freq: Once | INTRAVENOUS | Status: AC
  Administered 2014-08-04: 2 g via INTRAVENOUS
  Filled 2014-08-04 (×2): qty 20

## 2014-08-04 MED ORDER — SODIUM CHLORIDE 0.9 % IV SOLN
INTRAVENOUS | Status: AC
  Administered 2014-08-04 (×5): via INTRAVENOUS_CENTRAL
  Filled 2014-08-04 (×4): qty 200

## 2014-08-04 MED ORDER — SODIUM CHLORIDE 0.9 % IV SOLN: 2.0000 g | Freq: Once | INTRAVENOUS | Status: DC

## 2014-08-04 MED ORDER — ACETAMINOPHEN 325 MG PO TABS: 650.0000 mg | ORAL_TABLET | ORAL | Status: DC | PRN

## 2014-08-04 MED ORDER — INSULIN ASPART 100 UNIT/ML ~~LOC~~ SOLN
0.0000 [IU] | SUBCUTANEOUS | Status: DC
  Administered 2014-08-04: 3 [IU] via SUBCUTANEOUS
  Administered 2014-08-04 (×3): 2 [IU] via SUBCUTANEOUS
  Administered 2014-08-04: 3 [IU] via SUBCUTANEOUS
  Administered 2014-08-04 – 2014-08-05 (×3): 2 [IU] via SUBCUTANEOUS
  Administered 2014-08-05: 3 [IU] via SUBCUTANEOUS
  Administered 2014-08-05: 1 [IU] via SUBCUTANEOUS

## 2014-08-04 MED ORDER — POTASSIUM CHLORIDE 20 MEQ/15ML (10%) PO LIQD
40.0000 meq | Freq: Three times a day (TID) | ORAL | Status: AC
  Administered 2014-08-04 (×2): 40 meq
  Filled 2014-08-04 (×2): qty 30

## 2014-08-04 MED ORDER — ANTICOAGULANT SODIUM CITRATE 4% (200MG/5ML) IV SOLN
5.0000 mL | Freq: Once | Status: DC
Start: 2014-08-04 — End: 2014-08-05
  Filled 2014-08-04: qty 250

## 2014-08-04 MED ORDER — SODIUM CHLORIDE 0.9 % IV SOLN
4.0000 g | Freq: Once | INTRAVENOUS | Status: AC
  Administered 2014-08-04: 4 g via INTRAVENOUS
  Filled 2014-08-04 (×2): qty 40

## 2014-08-04 MED ORDER — LEVOTHYROXINE SODIUM 150 MCG PO TABS
150.0000 ug | ORAL_TABLET | Freq: Every day | ORAL | Status: DC
  Administered 2014-08-05: 150 ug
  Filled 2014-08-04 (×2): qty 1

## 2014-08-04 MED ORDER — SODIUM CHLORIDE 0.9 % IV SOLN
INTRAVENOUS | Status: AC
  Filled 2014-08-04 (×5): qty 200

## 2014-08-04 NOTE — Progress Notes (Signed)
PULMONARY / CRITICAL CARE MEDICINE   Name: Thomas Andrews MRN: [id number] DOB: [birth year]    ADMISSION DATE:  08/01/2014 CONSULTATION DATE:  08/04/2014  REFERRING MD :  Riley Kill  CHIEF COMPLAINT:  Hemoptysis  INITIAL PRESENTATION: 43 y.o. M with hx SLE, HTN, CKD transferred to CIR on 10/7 from [facility] where he had recent prolonged hospitalization during which he was intubated twice (extubated 9/25) and had a 3 day course of HFOV, cyclophosphamide, rituximab and amicar.  In addition, he received 3 rounds of PLEX and 3 days of pulse dose steroids.  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   On evening of 10/7, pt reportedly had 2 episodes of scant hemoptysis.  Later, he began to have respiratory distress requiring NRB.  PCCM was consulted and upon arrival at bedside, pt noted to be in moderate respiratory distress.  He was transferred to the ICU and started on BiPAP.  Unfortunately, SpO2 began to drop and pt's work of breathing began to increase.  He subsequently required intubation.  STUDIES:    SIGNIFICANT EVENTS: 10/7 - transferred to CIR from [facility]. 10/7 - PCCM consulted for hemoptysis, pt developed acute hypoxemic respiratory failure which failed BiPAP and required intubation. 10/7 - Bronched at bedside>> acute inflammation, thin bloody secretions, no focal source bleeding, sequential BAL became progressively bloodier 08/02/14: Did not get get bed at [facility]. Got TPE (plasma exchange) ; had HD cath placed 10/10 no beds available in [facility] as of now but weaning very well this AM, awaiting plasmapheresis today  SUBJECTIVE/OVERNIGHT/INTERVAL HX Alert and interactive, following all commands, comfortable on vent and weaning very well.  VITAL SIGNS: Temp:  [96.6 F (35.9 C)-97.6 F (36.4 C)] 97.6 F (36.4 C) (10/10 0000) Pulse Rate:  [60-99] 70 (10/10 0743) Resp:  [8-24] 24 (10/10 0743) BP: (119-163)/(76-95) 140/85  mmHg (10/10 0743) SpO2:  [95 %-100 %] 100 % (10/10 0743) Arterial Line BP: (161-164)/(82-83) 161/83 mmHg (10/09 1200) FiO2 (%):  [30 %-40 %] 30 % (10/10 0743) Weight:  [82.5 kg (181 lb 14.1 oz)-85.6 kg (188 lb 11.4 oz)] 85.6 kg (188 lb 11.4 oz) (10/10 0100)  HEMODYNAMICS:    VENTILATOR SETTINGS: Vent Mode:  [-] CPAP;PSV FiO2 (%):  [30 %-40 %] 30 % Set Rate:  [22 bmp] 22 bmp Vt Set:  [650 mL] 650 mL PEEP:  [8 cmH20] 8 cmH20 Pressure Support:  [5 cmH20-10 cmH20] 5 cmH20 Plateau Pressure:  [31 cmH20] 31 cmH20  INTAKE / OUTPUT: Intake/Output     10/09 0701 - 10/10 0700 10/10 0701 - 10/11 0700   I.V. (mL/kg) 1157.7 (13.5)    NG/GT 860    IV Piggyback 720    Total Intake(mL/kg) 2737.7 (32)    Urine (mL/kg/hr) 1920 (0.9)    Total Output 1920     Net +817.7           PHYSICAL EXAMINATION: General: WDWN young male, NAD on vent Neuro: RASS 2, nods appropriately, MAE, following all commands HEENT: [place]/AT. PERRL, sclerae anicteric. Cardiovascular: RRR, no M/R/G.  Lungs: resps even non labored on PS, few scattered rhonchi Abdomen: BS x 4, soft, NT/ND.  Musculoskeletal: No gross deformities, no edema.  Skin: Intact, warm, no rashes.  LABS: PULMONARY  Recent Labs Lab 08/01/14 2217 08/02/14 0353 08/02/14 2205 08/02/14 2321 08/03/14 0352 08/03/14 1946  PHART 7.437 7.394  --   --   --   --   PCO2ART 29.7* 35.0  --   --   --   --  PO2ART 52.0* 303.0*  --   --   --   --   HCO3 19.7* 20.9  --   --   --   --   TCO2 20.6 22.0 19 17 18 17   O2SAT 85.2 100.0  --   --   --   --    CBC  Recent Labs Lab 08/02/14 0500  08/03/14 0345 08/03/14 0352 08/03/14 1946 08/04/14 0441  HGB 10.1*  < > 7.4* 6.1* 8.5* 7.1*  HCT 30.7*  < > 22.9* 18.0* 25.0* 21.7*  WBC 7.2  --  1.3*  --   --  1.6*  PLT 107*  --  57*  --   --  49*  < > = values in this interval not displayed.  COAGULATION  Recent Labs Lab 08/02/14 1720  INR 1.38   CARDIAC  Recent Labs Lab 08/04/14 0441   TROPONINI <0.30   Recent Labs Lab 08/04/14 0441  PROBNP 5300.0*   CHEMISTRY  Recent Labs Lab 08/01/14 0620 08/02/14 0500  08/02/14 2321 08/03/14 0345 08/03/14 0352 08/03/14 1946 08/04/14 0441  NA 138 136*  < > 143 143 144 143 146  K 4.9 4.9  < > 4.3 4.2 3.7 4.6 4.3  CL 105 102  < > 110 111 114* 111 113*  CO2 24 22  --   --  17*  --   --  17*  GLUCOSE 89 209*  < > 109* 101* 93 129* 167*  BUN 60* 62*  < > 53* 51* 44* 50* 51*  CREATININE 1.91* 1.94*  < > 1.80* 1.63* 1.60* 1.90* 1.51*  CALCIUM 6.3* 6.0*  --   --  6.3*  --   --  6.8*  MG  --  1.9  --   --   --   --   --  1.6  PHOS  --  3.2  --   --   --   --   --  4.3  < > = values in this interval not displayed. Estimated Creatinine Clearance: 66.7 ml/min (by C-G formula based on Cr of 1.51).  LIVER  Recent Labs Lab 08/01/14 0620 08/02/14 1720  AST 151*  --   ALT 165*  --   ALKPHOS 47  --   BILITOT 0.3  --   PROT 4.8*  --   ALBUMIN 2.0*  --   INR  --  1.38   INFECTIOUS  Recent Labs Lab 08/02/14 1125 08/03/14 0345 08/04/14 0441  LATICACIDVEN 0.8  --   --   PROCALCITON 4.10 4.58 3.25   ENDOCRINE CBG (last 3)   Recent Labs  08/04/14 0023 08/04/14 0434 08/04/14 0800  GLUCAP 126* 177* 160*   IMAGING x48h Dg Chest Portable 1 View  08/03/2014   CLINICAL DATA:  Respiratory failure.  EXAM: PORTABLE CHEST - 1 VIEW  COMPARISON:  August 02, 2014.  FINDINGS: Stable cardiomediastinal silhouette. Endotracheal tube is in grossly good position with distal tip 2.5 cm above the carina. Right internal jugular venous sheath is unchanged in position. Left internal jugular catheter line is unchanged in position. Nasogastric tube is seen entering the stomach. No pneumothorax or significant pleural effusion is noted. Stable bilateral lung opacities are noted most consistent with pulmonary edema or possibly diffuse pneumonia. Bony thorax is intact.  IMPRESSION: Stable support apparatus. Stable bilateral lung opacities  concerning for edema or pneumonia.   Electronically Signed   By: Roque LiasJames  Green M.D.   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   - other etiologies : acute +/- chronic systolic CHF (confirmed echo 08/02/14)     :  infectious (see ID) Tolerating SBT 10/10  P:   Maintain on PS as tolerated Plasmapheresis today, if well tolerated will consider extubation in AM VAP bundle. DuoNebs / Albuterol. 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  - [facility] 08/02/14 WITH ACUTE +/- CHRONIC systolic chf ef 45%  P:  Diureses Hold outpatient norvasc, coreg, cardura, apresoline.  RENAL A:   CKD Baseline Cr when he left [facility] on 10/6 was 2 Lupus nephritis   - improving creat P:   KVO IVF Lasix 40 mg IV q6 hours x3 doses F/u chem  Replace electrolytes as indicated  GASTROINTESTINAL A:   GI prophylaxis Nutrition P:   SUP: Pantoprazole. Cont TF for now  HEMATOLOGIC A:   Anemia of chronic disease +/- acute blood loss Thrombocytopenia VTE Prophylaxis    - false low hgb 6's and recheck was 7./1gm%  P:  Transfuse per usual ICU guidelines; when hgb < 7gm% SCD's only. F/u cbc   INFECTIOUS BCx2 10/7 >>>NTD UCx 10/7 >>>NTD Sputum Cx 10/7 >>>NTD RVP 10/7 >>>NTD PCP Stain 10/7 >>> Fungal Stain 10/7 >>>TND CDiff 10/7>>>NEG Aspergillus galactomannan Ag 10/7 >>> A:   Recent aspergillus in sputum culture from 10/1 PCP prophylaxis HCAP?    - PCT not inconsistent with focal infections  P:   Abx: Mepron, start date 10/8,  Abx: Voriconazole, start date 10/8, Abx: Vanc start date 10/8 Abx: Imipenem start date 10/8  ENDOCRINE A:   At risk relative A.I. - has been on slow high dose  prednisone taper. P:   Solumedrol. CBG's q4hr. SSI.  NEUROLOGIC A:   Acute metabolic encephalopathy at admit to ICU 08/01/2014   - normal WUA 08/03/14 P:   Start fentanyl prn RASS goal: 0 to -1. Daily WUA.  RHEUMATOLOGIC A: SLE - suspect flare P: S/p TPE 08/02/14 and repeat 08/03/14 Pulse steroids - solumedrol 125mg  q6hrs x 3 days from 08/02/14, then start taper. Continue plaquenil. Per CIR note, next dose Rituxan 1000mg  due Friday 10/9 (TODAY); pharmacy working on conifrming this and getting patient on this Per CIR tx note,  next dose Cytoxan 450mg  on Saturday 10/17 Await  dS DNA, C3 and C4 complement from 08/02/14 - pending  Plasmapheresis today.  TODAY'S  STAFF MD SUMMARY: 43 y.o. M with SLE, just transferred to CIR on 10/7 from [facility] where he was ventilated for [facility].  Developed respiratory distress + scant hemoptysis while on CIR evening of 10/7.  Required transfer to ICU and intubation.  Bedside bronch done, sequential BAL suggestive of DAH.  Cultures all negative.   Needs tx back to [facility] -  (beds full as of 10 am and no call back from [facility]).  S/p PLEX 08/02/14 and repeat 08/03/14. Pharmacy working on [employer] for today. Plasmapheresis today and anticipate will be able to extubate in AM.  Family updated at bedside by NP  The patient is critically ill with multiple organ systems failure and requires high complexity decision making for assessment and support, frequent evaluation and titration of therapies, application of advanced monitoring technologies and extensive  interpretation of multiple databases.   Critical Care Time devoted to patient care services described in this note is  35  Minutes.  Alyson ReedyWesam G. Jelitza Manninen, M.D. [facility]. Pager: [phone]. After hours pager: [phone].   08/04/2014 10:18 AM

## 2014-08-04 NOTE — Progress Notes (Signed)
S:intubated, no further overt pulm bleeding O:BP 140/85  Pulse 70  Temp(Src) 97.6 F (36.4 C) (Oral)  Resp 24  Ht 5' 10.08" (1.78 m)  Wt 85.6 kg (188 lb 11.4 oz)  BMI 27.02 kg/m2  SpO2 100%  Intake/Output Summary (Last 24 hours) at 08/04/14 0801 Last data filed at 08/04/14 0700  Gross per 24 hour  Intake 2610.3 ml  Output   1920 ml  Net  690.3 ml   Weight change: 3.12 kg (6 lb 14.1 oz) ZOX:WRUEAGen:Awake and alert, intubated CVS:RRR Resp:bil crackles Abd:+ BS NTND Ext: 1-2+ edema NEURO:Follows commands.  Able to respond to ? By writing   . sodium chloride   Intravenous Once  . sodium chloride   Intravenous Once  . anticoagulant sodium citrate  5 mL Intracatheter Once  . anticoagulant sodium citrate  5 mL Intracatheter Once  . anticoagulant sodium citrate  5 mL Intracatheter Once  . antiseptic oral rinse  7 mL Mouth Rinse QID  . atovaquone  1,500 mg Per Tube Q breakfast  . calcium gluconate IVPB  4 g Intravenous Once  . chlorhexidine  15 mL Mouth Rinse BID  . furosemide  40 mg Intravenous 3 times per day  . hydroxychloroquine  400 mg Per Tube Daily  . imipenem-cilastatin  500 mg Intravenous 3 times per day  . insulin aspart  0-9 Units Subcutaneous 6 times per day  . ipratropium-albuterol  3 mL Nebulization Q6H  . levothyroxine  75 mcg Intravenous Daily  . methylPREDNISolone (SOLU-MEDROL) injection  125 mg Intravenous Q6H  . pantoprazole (PROTONIX) IV  40 mg Intravenous Daily  . potassium chloride  20 mEq Per Tube TID  . sodium chloride  3 mL Intravenous Q12H  . vancomycin  750 mg Intravenous Q12H  . voriconazole  200 mg Intravenous Q12H   Dg Chest Portable 1 View  08/03/2014   CLINICAL DATA:  Respiratory failure.  EXAM: PORTABLE CHEST - 1 VIEW  COMPARISON:  August 02, 2014.  FINDINGS: Stable cardiomediastinal silhouette. Endotracheal tube is in grossly good position with distal tip 2.5 cm above the carina. Right internal jugular venous sheath is unchanged in position. Left  internal jugular catheter line is unchanged in position. Nasogastric tube is seen entering the stomach. No pneumothorax or significant pleural effusion is noted. Stable bilateral lung opacities are noted most consistent with pulmonary edema or possibly diffuse pneumonia. Bony thorax is intact.  IMPRESSION: Stable support apparatus. Stable bilateral lung opacities concerning for edema or pneumonia.   Electronically Signed   By: Roque LiasJames  Green M.D.   On: 08/03/2014 08:03   Dg Chest Port 1 View  08/02/2014   CLINICAL DATA:  Past catheter placement  EXAM: PORTABLE CHEST - 1 VIEW  COMPARISON:  08/02/2014  FINDINGS: Right jugular central venous catheter placement with the tip projecting over the SVC. Left IJ central venous catheter with the tip projecting over the cavoatrial junction. Endotracheal tube with the tip 2.2 cm above the carina.  There is stable cardiomegaly. There is are patchy bilateral interstitial and alveolar airspace opacities. There are bilateral small pleural effusions. There is no pneumothorax.  Unremarkable osseous structures.  IMPRESSION: 1. Right jugular central venous catheter with the tip projecting over the SVC. 2. Left IJ central venous catheter with the tip projecting over the cavoatrial junction. 3. Endotracheal tube with the tip 2.2 cm above the carina. 4. Overall findings most consistent with pulmonary edema.   Electronically Signed   By: Elige KoHetal  Patel   On: 08/02/2014  21:58   BMET    Component Value Date/Time   NA 146 08/04/2014 0441   K 4.3 08/04/2014 0441   CL 113* 08/04/2014 0441   CO2 17* 08/04/2014 0441   GLUCOSE 167* 08/04/2014 0441   BUN 51* 08/04/2014 0441   CREATININE 1.51* 08/04/2014 0441   CALCIUM 6.8* 08/04/2014 0441   GFRNONAA 56* 08/04/2014 0441   GFRAA 65* 08/04/2014 0441   CBC    Component Value Date/Time   WBC 1.6* 08/04/2014 0441   RBC 2.63* 08/04/2014 0441   HGB 7.1* 08/04/2014 0441   HCT 21.7* 08/04/2014 0441   PLT 49* 08/04/2014 0441   MCV 82.5  08/04/2014 0441   MCH 27.0 08/04/2014 0441   MCHC 32.7 08/04/2014 0441   RDW 18.4* 08/04/2014 0441   LYMPHSABS 0.0* 08/04/2014 0441   MONOABS 0.1 08/04/2014 0441   EOSABS 0.0 08/04/2014 0441   BASOSABS 0.0 08/04/2014 0441     Assessment: 1.  SLE with DAH 2. CKD stable 3. HTN 4. Hypocalcemia.   Plan: 1. Awaiting transfer to [facility] 2. Plan plasmapheresis again today if not transferred   Dallyn Bergland T

## 2014-08-04 NOTE — Progress Notes (Signed)
eLink Physician-Brief Progress Note Patient Name: Thomas BoatmanDennis R Andrews DOB: 1971-02-01 MRN: [id number]   Date of Service  08/04/2014  HPI/Events of Note  Hyperglycemia on solumedrol, tube feedings  eICU Interventions  SSI     Intervention Category Intermediate Interventions: Hyperglycemia - evaluation and treatment  Angenette Daily 08/04/2014, 5:02 AM

## 2014-08-04 NOTE — Progress Notes (Signed)
eLink Physician-Brief Progress Note Patient Name: Thomas BoatmanDennis R Andrews DOB: 1971/09/02 MRN: [id number]   Date of Service  08/04/2014  HPI/Events of Note  Case reviewed Called [facility] around 2130 on 10/10, still no ICU bed available. Requested transfer again. Chart reviewed> he received Rituximab today and then plasmapheresis was performed several hours.  Discussed with Pete PeltJonathan Earl of pharmacy at length, he provided the following article: Br J Clin Pharmacol. 2013 Nov;76(5):734-40.  This showed that significant amounts of Rituxan is removed with PLEX.  Would hold off on further Rituximab.    eICU Interventions  Will need to re-dose rituxan after completing PLEX x5 courses.     Intervention Category Intermediate Interventions: Communication with other healthcare providers and/or family;Medication change / dose adjustment  [name] 08/04/2014, 12:24 AM

## 2014-08-04 NOTE — Progress Notes (Signed)
eLink Physician-Brief Progress Note Patient Name: Thomas BoatmanDennis R Plato DOB: 1971/07/03 MRN: [id number]   Date of Service  08/04/2014  HPI/Events of Note  [facility] but chart review indicates pt improving for possible extubation in am   eICU Interventions  Primary ccm team [facility]      Intervention Category Minor Interventions: Communication with other healthcare providers and/or family  Sandrea HughsMichael Wert 08/04/2014, 5:56 PM

## 2014-08-04 NOTE — Progress Notes (Signed)
TPE completed without issue, received 2 units of FFP at end of tx. 2g calcium gluconate given by floor RN as well as 4g ca gluconate given during tx. Pt has no complaints post treatment, vitals stable, report given to RN on 2S.

## 2014-08-05 ENCOUNTER — Inpatient Hospital Stay (HOSPITAL_COMMUNITY): Payer: BC Managed Care – PPO

## 2014-08-05 LAB — PREPARE FRESH FROZEN PLASMA
UNIT DIVISION: 0
UNIT DIVISION: 0

## 2014-08-05 LAB — BASIC METABOLIC PANEL
ANION GAP: 14 (ref 5–15)
Anion gap: 16 — ABNORMAL HIGH (ref 5–15)
BUN: 53 mg/dL — ABNORMAL HIGH (ref 6–23)
BUN: 62 mg/dL — ABNORMAL HIGH (ref 6–23)
CO2: 16 meq/L — AB (ref 19–32)
CO2: 17 meq/L — AB (ref 19–32)
Calcium: 7.2 mg/dL — ABNORMAL LOW (ref 8.4–10.5)
Calcium: 7.3 mg/dL — ABNORMAL LOW (ref 8.4–10.5)
Chloride: 116 mEq/L — ABNORMAL HIGH (ref 96–112)
Chloride: 118 mEq/L — ABNORMAL HIGH (ref 96–112)
Creatinine, Ser: 1.42 mg/dL — ABNORMAL HIGH (ref 0.50–1.35)
Creatinine, Ser: 1.57 mg/dL — ABNORMAL HIGH (ref 0.50–1.35)
GFR calc Af Amer: 62 mL/min — ABNORMAL LOW (ref >90)
GFR calc non Af Amer: 60 mL/min — ABNORMAL LOW (ref >90)
GFR calc non Af Amer: 53 mL/min — ABNORMAL LOW (ref >90)
GFR, EST AFRICAN AMERICAN: 70 mL/min — AB (ref >90)
Glucose, Bld: 222 mg/dL — ABNORMAL HIGH (ref 70–99)
Glucose, Bld: 140 mg/dL — ABNORMAL HIGH (ref 70–99)
POTASSIUM: 4.7 meq/L (ref 3.7–5.3)
POTASSIUM: 4.8 meq/L (ref 3.7–5.3)
SODIUM: 146 meq/L (ref 137–147)
SODIUM: 151 meq/L — AB (ref 137–147)

## 2014-08-05 LAB — POCT I-STAT 3, ART BLOOD GAS (G3+)
ACID-BASE DEFICIT: 7 mmol/L — AB (ref 0.0–2.0)
Acid-base deficit: 8 mmol/L — ABNORMAL HIGH (ref 0.0–2.0)
Acid-base deficit: 10 mmol/L — ABNORMAL HIGH (ref 0.0–2.0)
BICARBONATE: 19.1 meq/L — AB (ref 20.0–24.0)
Bicarbonate: 17.6 mEq/L — ABNORMAL LOW (ref 20.0–24.0)
Bicarbonate: 17.4 mEq/L — ABNORMAL LOW (ref 20.0–24.0)
O2 Saturation: 92 %
O2 Saturation: 61 %
O2 Saturation: 100 %
PCO2 ART: 37 mmHg (ref 35.0–45.0)
PH ART: 7.284 — AB (ref 7.350–7.450)
TCO2: 19 mmol/L (ref 0–100)
TCO2: 19 mmol/L (ref 0–100)
TCO2: 20 mmol/L (ref 0–100)
pCO2 arterial: 49.5 mmHg — ABNORMAL HIGH (ref 35.0–45.0)
pCO2 arterial: 42.6 mmHg (ref 35.0–45.0)
pH, Arterial: 7.162 — CL (ref 7.350–7.450)
pH, Arterial: 7.261 — ABNORMAL LOW (ref 7.350–7.450)
pO2, Arterial: 71 mmHg — ABNORMAL LOW (ref 80.0–100.0)
pO2, Arterial: 44 mmHg — ABNORMAL LOW (ref 80.0–100.0)
pO2, Arterial: 325 mmHg — ABNORMAL HIGH (ref 80.0–100.0)

## 2014-08-05 LAB — TRIGLYCERIDES: TRIGLYCERIDES: 179 mg/dL — AB (ref <150)

## 2014-08-05 LAB — CBC WITH DIFFERENTIAL/PLATELET
Basophils Absolute: 0 10*3/uL (ref 0.0–0.1)
Basophils Relative: 0 % (ref 0–1)
Eosinophils Absolute: 0 10*3/uL (ref 0.0–0.7)
Eosinophils Relative: 0 % (ref 0–5)
HCT: 23.5 % — ABNORMAL LOW (ref 39.0–52.0)
Hemoglobin: 7.4 g/dL — ABNORMAL LOW (ref 13.0–17.0)
LYMPHS ABS: 0.1 10*3/uL — AB (ref 0.7–4.0)
LYMPHS PCT: 2 % — AB (ref 12–46)
MCH: 27 pg (ref 26.0–34.0)
MCHC: 31.5 g/dL (ref 30.0–36.0)
MCV: 85.8 fL (ref 78.0–100.0)
Monocytes Absolute: 0.1 10*3/uL (ref 0.1–1.0)
Monocytes Relative: 2 % — ABNORMAL LOW (ref 3–12)
NEUTROS PCT: 96 % — AB (ref 43–77)
Neutro Abs: 4.2 10*3/uL (ref 1.7–7.7)
PLATELETS: 53 10*3/uL — AB (ref 150–400)
RBC: 2.74 MIL/uL — ABNORMAL LOW (ref 4.22–5.81)
RDW: 19.6 % — AB (ref 11.5–15.5)
WBC: 4.4 10*3/uL (ref 4.0–10.5)

## 2014-08-05 LAB — GLUCOSE, CAPILLARY
GLUCOSE-CAPILLARY: 198 mg/dL — AB (ref 70–99)
GLUCOSE-CAPILLARY: 186 mg/dL — AB (ref 70–99)

## 2014-08-05 LAB — PHOSPHORUS: PHOSPHORUS: 4 mg/dL (ref 2.3–4.6)

## 2014-08-05 LAB — VANCOMYCIN, TROUGH: Vancomycin Tr: 32.7 ug/mL (ref 10.0–20.0)

## 2014-08-05 LAB — MAGNESIUM: MAGNESIUM: 1.5 mg/dL (ref 1.5–2.5)

## 2014-08-05 MED ORDER — MIDAZOLAM HCL 2 MG/2ML IJ SOLN
INTRAMUSCULAR | Status: AC
  Administered 2014-08-05: 2 mg
  Filled 2014-08-05: qty 6

## 2014-08-05 MED ORDER — FENTANYL CITRATE 0.05 MG/ML IJ SOLN
INTRAMUSCULAR | Status: AC
  Administered 2014-08-05: 100 ug
  Filled 2014-08-05: qty 6

## 2014-08-05 MED ORDER — FENTANYL CITRATE 0.05 MG/ML IJ SOLN: 100.0000 ug | Freq: Once | INTRAMUSCULAR | Status: DC

## 2014-08-05 MED ORDER — VORICONAZOLE 200 MG IV SOLR
4.0000 mg/kg | Freq: Two times a day (BID) | INTRAVENOUS | Status: DC
  Filled 2014-08-05: qty 350

## 2014-08-05 MED ORDER — PROPOFOL BOLUS VIA INFUSION
100.0000 mg | Freq: Once | INTRAVENOUS | Status: AC
  Administered 2014-08-05: 100 mg via INTRAVENOUS

## 2014-08-05 MED ORDER — FUROSEMIDE 10 MG/ML IJ SOLN
40.0000 mg | Freq: Four times a day (QID) | INTRAMUSCULAR | Status: DC
  Administered 2014-08-05 (×2): 40 mg via INTRAVENOUS
  Filled 2014-08-05: qty 4

## 2014-08-05 MED ORDER — CETYLPYRIDINIUM CHLORIDE 0.05 % MT LIQD
7.0000 mL | Freq: Four times a day (QID) | OROMUCOSAL | Status: DC
  Administered 2014-08-05: 7 mL via OROMUCOSAL

## 2014-08-05 MED ORDER — IMIPENEM-CILASTATIN 500 MG IV SOLR
500.0000 mg | Freq: Four times a day (QID) | INTRAVENOUS | Status: DC
  Administered 2014-08-05 (×2): 500 mg via INTRAVENOUS
  Filled 2014-08-05 (×4): qty 500

## 2014-08-05 MED ORDER — MIDAZOLAM HCL 2 MG/2ML IJ SOLN: 2.0000 mg | Freq: Once | INTRAMUSCULAR | Status: DC

## 2014-08-05 MED ORDER — CETYLPYRIDINIUM CHLORIDE 0.05 % MT LIQD
7.0000 mL | Freq: Two times a day (BID) | OROMUCOSAL | Status: DC
  Administered 2014-08-05: 7 mL via OROMUCOSAL

## 2014-08-05 MED ORDER — ROCURONIUM BROMIDE 50 MG/5ML IV SOLN
40.0000 mg | Freq: Once | INTRAVENOUS | Status: AC
  Administered 2014-08-05: 40 mg via INTRAVENOUS

## 2014-08-05 MED ORDER — SODIUM CHLORIDE 0.9 % IV SOLN
1.0000 mg/h | INTRAVENOUS | Status: DC
  Administered 2014-08-05: 2 mg/h via INTRAVENOUS
  Filled 2014-08-05: qty 10

## 2014-08-05 MED ORDER — CHLORHEXIDINE GLUCONATE 0.12 % MT SOLN: 15.0000 mL | Freq: Two times a day (BID) | OROMUCOSAL | Status: DC

## 2014-08-05 NOTE — Progress Notes (Signed)
SLP Cancellation Note  Patient Details Name: Sheralyn BoatmanDennis R Spinola MRN: [id number] DOB: 08-17-1971   Cancelled treatment:       Reason Eval/Treat Not Completed: Medical issues which prohibited therapy. Pt now on BiPAP. Will continue to follow.   [place]ro [place], Letta Cargile K, MA, CCC-SLP 08/05/2014, 12:07 PM

## 2014-08-05 NOTE — Progress Notes (Signed)
PULMONARY / CRITICAL CARE MEDICINE   Name: Thomas BoatmanDennis R Shisler MRN: [id number] DOB: 1970/11/12    ADMISSION DATE:  08/01/2014 CONSULTATION DATE:  08/05/2014  REFERRING MD :  Riley KillSwartz  CHIEF COMPLAINT:  Hemoptysis  INITIAL PRESENTATION: 43 y.o. M with hx SLE, HTN, CKD transferred to CIR on 10/7 from [facility] where he had recent prolonged hospitalization during which he was intubated twice (extubated 9/25) and had a 3 day course of HFOV, cyclophosphamide, rituximab and amicar.  In addition, he received 3 rounds of PLEX and 3 days of pulse dose steroids.  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   On evening of 10/7, pt reportedly had 2 episodes of scant hemoptysis.  Later, he began to have respiratory distress requiring NRB.  PCCM was consulted and upon arrival at bedside, pt noted to be in moderate respiratory distress.  He was transferred to the ICU and started on BiPAP.  Unfortunately, SpO2 began to drop and pt's work of breathing began to increase.  He subsequently required intubation.  STUDIES:    SIGNIFICANT EVENTS: 10/7 - transferred to CIR from [facility]. 10/7 - PCCM consulted for hemoptysis, pt developed acute hypoxemic respiratory failure which failed BiPAP and required intubation. 10/7 - Bronched at bedside>> acute inflammation, thin bloody secretions, no focal source bleeding, sequential BAL became progressively bloodier 08/02/14: Did not get [facility]. Got TPE (plasma exchange) ; had HD cath placed 10/10 no beds available in [facility] as of now but weaning very well this AM, awaiting plasmapheresis today  SUBJECTIVE/OVERNIGHT/INTERVAL HX Alert and interactive, following all commands, comfortable on vent and weaning very well.  Bloodier secretion but O2 demand is lower this AM.  VITAL SIGNS: Temp:  [97 F (36.1 C)-98.3 F (36.8 C)] 98.3 F (36.8 C) (10/11 0700) Pulse Rate:  [76-114] 105 (10/11 0900) Resp:   [12-28] 28 (10/11 0900) BP: (124-153)/(77-98) 153/89 mmHg (10/11 0900) SpO2:  [83 %-100 %] 95 % (10/11 0900) FiO2 (%):  [30 %-40 %] 30 % (10/11 0804) Weight:  [87.9 kg (193 lb 12.6 oz)] 87.9 kg (193 lb 12.6 oz) (10/11 0415)  HEMODYNAMICS:    VENTILATOR SETTINGS: Vent Mode:  [-] PSV;CPAP FiO2 (%):  [30 %-40 %] 30 % Set Rate:  [22 bmp] 22 bmp Vt Set:  [650 mL] 650 mL PEEP:  [5 cmH20] 5 cmH20 Pressure Support:  [5 cmH20] 5 cmH20 Plateau Pressure:  [10 cmH20-33 cmH20] 33 cmH20  INTAKE / OUTPUT: Intake/Output     10/10 0701 - 10/11 0700 10/11 0701 - 10/12 0700   I.V. (mL/kg) 823.9 (9.4) 29.8 (0.3)   NG/GT 1580 200   IV Piggyback 1690    Total Intake(mL/kg) 4093.9 (46.6) 229.8 (2.6)   Urine (mL/kg/hr) 2375 (1.1) 250 (0.9)   Total Output 2375 250   Net +1718.9 -20.2         PHYSICAL EXAMINATION: General: WDWN young male, NAD on vent Neuro: RASS 2, nods appropriately, MAE, following all commands HEENT: [place]/AT. PERRL, sclerae anicteric. Cardiovascular: RRR, no M/R/G.  Lungs: resps even non labored on PS, few scattered rhonchi Abdomen: BS x 4, soft, NT/ND.  Musculoskeletal: No gross deformities, no edema.  Skin: Intact, warm, no rashes.  LABS: PULMONARY  Recent Labs Lab 08/01/14 2217 08/02/14 0353  08/02/14 2321 08/03/14 0352 08/03/14 1946 08/04/14 1714 08/05/14 0559  PHART 7.437 7.394  --   --   --   --   --  7.284*  PCO2ART 29.7* 35.0  --   --   --   --   --  37.0  PO2ART 52.0* 303.0*  --   --   --   --   --  71.0*  HCO3 19.7* 20.9  --   --   --   --   --  17.6*  TCO2 20.6 22.0  < > 17 18 17 19 19   O2SAT 85.2 100.0  --   --   --   --   --  92.0  < > = values in this interval not displayed. CBC  Recent Labs Lab 08/03/14 0345  08/04/14 0441 08/04/14 1714 08/05/14 0415  HGB 7.4*  < > 7.1* 6.8* 7.4*  HCT 22.9*  < > 21.7* 20.0* 23.5*  WBC 1.3*  --  1.6*  --  4.4  PLT 57*  --  49*  --  53*  < > = values in this interval not displayed.  COAGULATION  Recent  Labs Lab 08/02/14 1720  INR 1.38   CARDIAC  Recent Labs Lab 08/04/14 0441  TROPONINI <0.30    Recent Labs Lab 08/04/14 0441  PROBNP 5300.0*   CHEMISTRY  Recent Labs Lab 08/01/14 0620 08/02/14 0500  08/03/14 0345 08/03/14 0352 08/03/14 1946 08/04/14 0441 08/04/14 1714 08/05/14 0415  NA 138 136*  < > 143 144 143 146 145 146  K 4.9 4.9  < > 4.2 3.7 4.6 4.3 4.8 4.7  CL 105 102  < > 111 114* 111 113* 117* 116*  CO2 24 22  --  17*  --   --  17*  --  16*  GLUCOSE 89 209*  < > 101* 93 129* 167* 216* 222*  BUN 60* 62*  < > 51* 44* 50* 51* 48* 53*  CREATININE 1.91* 1.94*  < > 1.63* 1.60* 1.90* 1.51* 1.80* 1.42*  CALCIUM 6.3* 6.0*  --  6.3*  --   --  6.8*  --  7.2*  MG  --  1.9  --   --   --   --  1.6  --  1.5  PHOS  --  3.2  --   --   --   --  4.3  --  4.0  < > = values in this interval not displayed. Estimated Creatinine Clearance: 76.6 ml/min (by C-G formula based on Cr of 1.42).  LIVER  Recent Labs Lab 08/01/14 0620 08/02/14 1720  AST 151*  --   ALT 165*  --   ALKPHOS 47  --   BILITOT 0.3  --   PROT 4.8*  --   ALBUMIN 2.0*  --   INR  --  1.38   INFECTIOUS  Recent Labs Lab 08/02/14 1125 08/03/14 0345 08/04/14 0441  LATICACIDVEN 0.8  --   --   PROCALCITON 4.10 4.58 3.25   ENDOCRINE CBG (last 3)   Recent Labs  08/04/14 2334 08/05/14 0418 08/05/14 0757  GLUCAP 185* 198* 186*   IMAGING x48h Dg Chest Port 1v Same Day  08/04/2014   CLINICAL DATA:  Respiratory failure.  EXAM: PORTABLE CHEST - 1 VIEW SAME DAY  COMPARISON:  08/03/2014.  FINDINGS: The support apparatus is stable. The left IJ catheter tip is in the right atrium and could be retracted 4 cm. The lungs demonstrate improved aeration with resolving edema and atelectasis.  IMPRESSION: Stable support apparatus. The left IJ catheter tip remains in the right atrium.  Improved lung aeration.   Electronically Signed  By: Loralie Champagne M.D.   On: 08/04/2014 08:27   ASSESSMENT /  PLAN:  PULMONARY OETT 10/7 >>> A: Acute hypoxemic respiratory failure with Hemoptysis in setting of Recent refractory lupus pneumonitis   - due to    -  DAH - sequential BAL performed at bedside supportive of DAH (see image in bronch note).   - other etiologies : acute +/- chronic systolic CHF (confirmed echo 08/02/14)     :  infectious (see ID) Tolerating SBT 10/10  P:   Extubate today, if fails due to inability to clear secretion then will reintubate Holding plasmapheresis today VAP bundle. DuoNebs / Albuterol. 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  - [facility] 08/02/14 WITH ACUTE +/- CHRONIC systolic chf ef 45%  P:  Diureses Hold outpatient norvasc, coreg, cardura, apresoline.  RENAL A:   CKD Baseline Cr when he left [facility] on 10/6 was 2 Lupus nephritis   - improving creat P:   KVO IVF Lasix 40 mg IV q6 hours x3 doses F/u chem  Replace electrolytes as indicated  GASTROINTESTINAL A:   GI prophylaxis Nutrition P:   SUP: Pantoprazole. Swallow evaluation  HEMATOLOGIC A:   Anemia of chronic disease +/- acute blood loss Thrombocytopenia VTE Prophylaxis  - false low hgb 6's and recheck was 7./1gm%  P:  Transfuse per usual ICU guidelines; when hgb < 7gm% SCD's only. F/u cbc   INFECTIOUS BCx2 10/7 >>>NTD UCx 10/7 >>>NTD Sputum Cx 10/7 >>>NTD RVP 10/7 >>>NTD PCP Stain 10/7 >>> Fungal Stain 10/7 >>>TND CDiff 10/7>>>NEG Aspergillus galactomannan Ag 10/7 >>> A:   Recent aspergillus in sputum culture from 10/1 PCP prophylaxis HCAP?    - PCT not inconsistent with focal infections  P:   Abx: Mepron, start date 10/8,  Abx: Voriconazole, start date 10/8, Abx: Vanc start date 10/8 Abx: Imipenem start date 10/8  ENDOCRINE A:   At risk relative A.I. - has been on slow high dose prednisone taper. P:   Solumedrol. CBG's  q4hr. SSI.  NEUROLOGIC A:   Acute metabolic encephalopathy at admit to ICU 08/01/2014   - normal WUA 08/03/14 P:   PRN fentanyl D/C sedation Daily WUA.  RHEUMATOLOGIC A: SLE - suspect flare P: S/p TPE 08/02/14 and repeat 08/03/14 Pulse steroids - solumedrol 125mg  q6hrs x 3 days from 08/02/14, then start taper. Continue plaquenil. Per CIR note, next dose Rituxan 1000mg  due Friday 10/9 (TODAY); pharmacy working on conifrming this and getting patient on this Per CIR tx note,  next dose Cytoxan 450mg  on Saturday 10/17 Await  dS DNA, C3 and C4 complement from 08/02/14 - pending  Plasmapheresis per renal.  TODAY'S  STAFF MD SUMMARY: 43 y.o. M with SLE, just transferred to CIR on 10/7 from [facility] where he was ventilated for [facility].  Developed respiratory distress + scant hemoptysis while on CIR evening of 10/7.  Required transfer to ICU and intubation.  Bedside bronch done, sequential BAL suggestive of DAH.  Cultures all negative.   Extubate today and transfer to [facility] when bed is available  Family updated at bedside  The patient is critically ill with multiple organ systems failure and requires high complexity decision making for assessment and support, frequent evaluation and titration of therapies, application of advanced monitoring technologies and extensive interpretation of multiple databases.   Critical Care Time devoted to patient care services described in this  note is  35  Minutes.  Alyson ReedyWesam G. Dawood Spitler, M.D. [facility] Pulmonary/Critical Care Medicine. Pager: [phone]. After hours pager: [phone].   08/05/2014 10:14 AM

## 2014-08-05 NOTE — Procedures (Signed)
Extubation Procedure Note  Patient Details:   Name: Thomas Andrews DOB: [birth year] MRN: [id number]   Airway Documentation:     Evaluation  O2 sats: currently acceptable Complications: No apparent complications Patient did tolerate procedure well. Bilateral Breath Sounds: Clear Suctioning: Airway Yes  Prior to extubation: Pt suctioned orally, via ETT and subglottic tube.  Positive cuff leak noted.  Post-extubation:  Pt able to cough to produce sputum, state his name, no stridor noted.  Antoine Pocherogdon, Rane Blitch Caroline 08/05/2014, 10:54 AM

## 2014-08-05 NOTE — Progress Notes (Signed)
eLink Physician-Brief Progress Note Patient Name: Thomas BoatmanDennis R Vanpatten DOB: 04-Oct-1971 MRN: [id number]   Date of Service  08/05/2014  HPI/Events of Note  Re-intubated today Have called [facility] again for transfer  eICU Interventions  Will update interim discharge summary     Intervention Category Major Interventions: Respiratory failure - evaluation and management  MCQUAID, DOUGLAS 08/05/2014, 3:05 PM

## 2014-08-05 NOTE — Procedures (Signed)
Intubation Procedure Note Thomas BoatmanDennis R Andrews [id number] 12/27/1970  Procedure: Intubation Indications: Respiratory insufficiency  Procedure Details Consent: Risks of procedure as well as the alternatives and risks of each were explained to the (patient/caregiver).  Consent for procedure obtained. Time Out: Verified patient identification, verified procedure, site/side was marked, verified correct patient position, special equipment/implants available, medications/allergies/relevent history reviewed, required imaging and test results available.  Performed  Maximum sterile technique was used including gown, hand hygiene and mask.  MAC and 4    Evaluation Hemodynamic Status: BP stable throughout; O2 sats: stable throughout Patient's Current Condition: stable Complications: No apparent complications Patient did tolerate procedure well. Chest X-ray ordered to verify placement.  CXR: pending.   Thomas BucksFEINSTEIN,Thomas Andrews. 08/05/2014   Upon evaluation with scope, TF throughout and blood pouring out of airway Suctioned Tube placed  Thomas Rossettianiel Andrews. Tyson AliasFeinstein, MD, FACP Pgr: [phone] [place] [facility]

## 2014-08-05 NOTE — Progress Notes (Signed)
Dr. Molli KnockYacoub notified of patient's status at 1130. O2 sats 70s with good waveform. Heart rate 130s-150s, sinus tachycardia. RR 30-40s. BP elevated. ABG obtained by RT per order. BiPAP initiated. Dr. Tyson AliasFeinstein to unit at 1210. Pt reintubated after 100mcg Fentanyl, 2mg  Versed given per Dr. Tyson AliasFeinstein verbal order. 40mg  Roc given post intubation in order for Dr. Tyson AliasFeinstein to perform bronch. Fentanyl drip and propofol drips restarted per order. Family updated and educated.

## 2014-08-05 NOTE — Progress Notes (Signed)
[facility] called [facility], [facility] loading patient, report already given to them.

## 2014-08-05 NOTE — Progress Notes (Signed)
eLink Physician-Brief Progress Note Patient Name: Thomas BoatmanDennis R Andrews DOB: 1971/08/07 MRN: [id number]   Date of Service  08/05/2014  HPI/Events of Note  Called by nursing for tremor, no current fever On high dose propofol ?related to propofol" Family had a lot of questions, addressed through camera at length Swelling in legs  eICU Interventions  Change propofol to versed, continue fentanyl Family updated Will check ultrasound of legs for DVT     Intervention Category Major Interventions: Respiratory failure - evaluation and management Intermediate Interventions: Medication change / dose adjustment Minor Interventions: Communication with other healthcare providers and/or family  Max FickleMCQUAID, DOUGLAS 08/05/2014, 4:42 PM

## 2014-08-05 NOTE — Progress Notes (Signed)
eLink Physician-Brief Progress Note Patient Name: Thomas BoatmanDennis R Joplin DOB: July 10, 1971 MRN: [id number]   Date of Service  08/05/2014  HPI/Events of Note  10/7 doppler legs negative dvt  eICU Interventions  Cancel today's order     Intervention Category Minor Interventions: Routine modifications to care plan (e.g. PRN medications for pain, fever)  Nollie Terlizzi 08/05/2014, 5:09 PM

## 2014-08-05 NOTE — Progress Notes (Signed)
CRITICAL VALUE ALERT  Critical value received: Vanc trough  Date of notification:  08/05/14  Time of notification:  1444  Critical value read back:Yes.    Nurse who received alert:  Doylene BodeEmily Jeanna Giuffre, RN  MD notified (1st page):  Pharmacy  Time of first page:  1446  MD notified (2nd page):  Time of second page:  Responding MD:  Pharmacy   Time MD responded:  [phone]

## 2014-08-05 NOTE — Procedures (Addendum)
Bronchoscopy Procedure Note Thomas Andrews [id number] 1971/08/11  Procedure: Bronchoscopy Indications: Diagnostic evaluation of the airways, Obtain specimens for culture and/or other diagnostic studies and Remove secretions Post intubation desaturation,r/o DAH, collapse? Procedure Details Consent: Unable to obtain consent because of emergent medical necessity. Time Out: Verified patient identification, verified procedure, site/side was marked, verified correct patient position, special equipment/implants available, medications/allergies/relevent history reviewed, required imaging and test results available.  Performed  In preparation for procedure, patient was given 100% FiO2 and bronchoscope lubricated. Sedation: prop  Airway entered and the following bronchi were examined: RUL, RML, RLL, LUL, LLL and Bronchi.   Procedures performed: Brushings performed - no Bronchoscope removed.  , Patient placed back on 100% FiO2 at conclusion of procedure.    Evaluation Hemodynamic Status: BP stable throughout; O2 sats: stable throughout Patient's Current Condition: stable Specimens:  Sent serosanguinous fluid- pink, does not clear Complications: No apparent complications Patient did tolerate procedure well.   [name],[name] J. 08/05/2014  1. DAH Likely , old blood throughout but never cleared from lavage rml 2. No sig TF 3. BAL RML, pink with no clearance 4. No collapse 5. Tube adjusted out 4 cm  [name], MD, FACP Pgr: [phone] [place] [facility]

## 2014-08-05 NOTE — Discharge Summary (Signed)
[place] [facility] Interim Discharge Summary  Patient ID:  Thomas BoatmanDennis R Freiberger  MRN: [id number]  DOB/AGE: [birth year] 43 y.o.    Admit date: 08/01/2014  Discharge date: 08/02/2014  Discharge Diagnoses:  Active Problems:  Lupus nephritis  Diffuse pulmonary alveolar hemorrhage  Acute on chronic renal failure  Acute respiratory failure with hypoxemia   Brief Summary: Thomas BoatmanDennis R Minassian is a 43 y.o. M with hx SLE, HTN, CKD transferred to CIR on 10/7 from [facility] where he had recent prolonged hospitalization during which he was intubated twice (extubated 9/25) and had a 3 day course of HFOV, cyclophosphamide, rituximab and amicar. In addition, he received several rounds of PLEX and 3 days of pulse dose steroids. 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 He was discharged from [facility] to [facility].  On evening of 10/7, pt reportedly had 2 episodes of scant hemoptysis. Later, he began to have respiratory distress requiring NRB. PCCM was consulted and upon arrival at bedside, pt noted to be in moderate respiratory distress. He was transferred to the ICU and started on BiPAP. Unfortunately, SpO2 began to drop and pt's work of breathing began to increase. He subsequently required intubation.  Bronchoscopy at the time of intubation was consistent with DAH.  At our facility he received broad spectrum antibiotics for HCAP, received plasma exchange daily from 10/8 through 10/10, and Rituxan on 10/9.  He was extubated on 10/11 but was re-intubated for possible aspiration.  A bronchoscopy on 10/11 showed bloody secretions.  During our hospitalization he was maintained on voriconazole.  Transfer to [facility] was requested due to his refractory lupus pneumonitis and the fact that they are familiar with his case from his recent hospitalization.    Consults:  Renal  Lines/tubes:  ETT  10/8>>>  L IJ CVL 10/8>>>  R IJ HD cath 10/11 >>  SIGNIFICANT EVENTS:  10/7 - transferred to CIR from [facility].  10/7 - PCCM consulted for hemoptysis, pt developed acute hypoxemic respiratory failure which failed BiPAP and required intubation.  10/7 - Bronched at bedside>> acute inflammation, thin bloody secretions, no focal source bleeding, sequential BAL became progressively bloodier  10/8 - Plasma exchange started 10/9 - Given Rituxan 1000mg , Plasma exchange 10/10 - Plasma exchange 10/11 - extubated aspirated, re-intubated, bronchoscopy > bloody  Transfer Plan by Discharge Diagnosis  PULMONARY  OETT 10/7 >>> 10/11; 10/11 >> A:  Acute hypoxemic respiratory failure  Hemoptysis  Recent refractory lupus pneumonitis  DAH - sequential BAL performed at bedside supportive of DAH (see image in bronch note).  Aspiration pneumonia 10/11? P:  Full mechanical support, wean as able.  VAP bundle.  DuoNebs / Albuterol.  Pulse steroids - solumedrol 125mg  q6hrs, will taper 10/12 if still here.  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  P:  Holding outpatient norvasc, coreg, cardura, apresoline.   RENAL  A:  CKD Baseline Cr when he left [facility] on 10/6 was 2  Lupus nephritis but renal function stable at [facility] P:  NS @ 100.  F/u chem   GASTROINTESTINAL  A:  GI prophylaxis  Nutrition  P:  SUP: Pantoprazole.  Start TF 10/8   HEMATOLOGIC  A:  Anemia of chronic disease +/- acute blood loss  Thrombocytopenia  VTE Prophylaxis  P:  Transfuse per usual ICU guidelines.  SCD's only.  F/u cbc   INFECTIOUS  A:  Recent aspergillus in sputum culture from 10/1  PCP prophylaxis  HCAP?  P:  BCx2 10/7 >>>  UCx 10/7 >>>  Sputum Cx 10/7 >>>  RVP 10/7 >>>  PCP Stain 10/7 >>>  Fungal Stain 10/7 >>>  CDiff 10/7>>>NEG  Aspergillus galactomannan Ag 10/7 >>>  Abx: Mepron, start date 10/8 (continued from prior)  Abx: Voriconazole, start date 10/8  (continued from prior).  Abx: Vanc start date 10/8, day 4/x Abx: Imipenem start date 10/8 day 4/x   ENDOCRINE  A:  At risk relative A.I. - has been on slow high dose prednisone taper.  P:  Solumedrol as above CBG's q4hr.  SSI.   NEUROLOGIC  A:  Acute metabolic encephalopathy  P:  Sedation: Propofol gtt / Fentanyl gtt.  RASS goal: 0 to -1.  Daily WUA.    RHEUMATOLOGIC  A:  SLE - flaring  P:  Continue plaquenil.  Solumedrol pulse dose > continue Ritxuan given 10/9  Received PLEX 10/8 through 10/10 Never given cytoxan during stay at [facility] dS DNA, C3 and C4 complement - pending  Needs close rheumatology f/u    Discharge exam: Filed Vitals:   08/05/14 1330 08/05/14 1400 08/05/14 1500 08/05/14 1530  BP:  125/86 157/93   Pulse: 95 90    Temp:      TempSrc:      Resp: 30 30 30    Height:      Weight:      SpO2: 100% 100% 100% 100%   Vent Mode:  [-] PRVC FiO2 (%):  [30 %-100 %] 100 % Set Rate:  [22 bmp-30 bmp] 30 bmp Vt Set:  [650 mL] 650 mL PEEP:  [5 cmH20] 5 cmH20 Pressure Support:  [5 cmH20] 5 cmH20 Plateau Pressure:  [26 cmH20-42 cmH20] 42 cmH20  Examined by partner Tyson AliasFeinstein 10/11, per him: GEN: sedated on vent HEENT: NCAT, ETT in place PULM: rhonchi bilaterally CV: tachy, + JVD AB: distended, BS+ Ext: edema bilaterally Neuro: sedated on vent, follows commands   Discharge medications: . anticoagulant sodium citrate  5 mL Intracatheter Once  . antiseptic oral rinse  7 mL Mouth Rinse QID  . atovaquone  1,500 mg Per Tube Q breakfast  . chlorhexidine  15 mL Mouth Rinse BID  . fentaNYL  100 mcg Intravenous Once  . furosemide  40 mg Intravenous Q6H  . hydroxychloroquine  400 mg Per Tube Daily  . imipenem-cilastatin  500 mg Intravenous 4 times per day  . insulin aspart  0-9 Units Subcutaneous 6 times per day  . ipratropium-albuterol  3 mL Nebulization Q6H  . levothyroxine  150 mcg Per Tube QAC breakfast  . methylPREDNISolone (SOLU-MEDROL) injection   125 mg Intravenous Q6H  . midazolam  2 mg Intravenous Once  . pantoprazole sodium  40 mg Per Tube Daily  . propofol  100 mg Intravenous Once  . rocuronium  40 mg Intravenous Once  . sodium chloride  10-40 mL Intracatheter Q12H  . vancomycin  750 mg Intravenous Q12H  . voriconazole  4 mg/kg Intravenous Q12H

## 2014-08-05 NOTE — Progress Notes (Signed)
S: no further pulm hem O:BP 145/97  Pulse 83  Temp(Src) 98.2 F (36.8 C) (Oral)  Resp 22  Ht 5' 10.08" (1.78 m)  Wt 87.9 kg (193 lb 12.6 oz)  BMI 27.74 kg/m2  SpO2 91%  Intake/Output Summary (Last 24 hours) at 08/05/14 0730 Last data filed at 08/05/14 0700  Gross per 24 hour  Intake 4093.85 ml  Output   2375 ml  Net 1718.85 ml   Weight change: 5.4 kg (11 lb 14.5 oz) ZOX:WRUEAGen:Awake and alert, intubated CVS:RRR Resp:bil crackles Abd:+ BS NTND Ext: 1-2+ edema NEURO:Follows commands.  Able to respond to ? By writing   . anticoagulant sodium citrate  5 mL Intracatheter Once  . antiseptic oral rinse  7 mL Mouth Rinse QID  . atovaquone  1,500 mg Per Tube Q breakfast  . chlorhexidine  15 mL Mouth Rinse BID  . hydroxychloroquine  400 mg Per Tube Daily  . imipenem-cilastatin  500 mg Intravenous 3 times per day  . insulin aspart  0-9 Units Subcutaneous 6 times per day  . ipratropium-albuterol  3 mL Nebulization Q6H  . levothyroxine  150 mcg Per Tube QAC breakfast  . methylPREDNISolone (SOLU-MEDROL) injection  125 mg Intravenous Q6H  . pantoprazole sodium  40 mg Per Tube Daily  . sodium chloride  10-40 mL Intracatheter Q12H  . vancomycin  750 mg Intravenous Q12H  . voriconazole  200 mg Intravenous Q12H   Dg Chest Port 1v Same Day  08/04/2014   CLINICAL DATA:  Respiratory failure.  EXAM: PORTABLE CHEST - 1 VIEW SAME DAY  COMPARISON:  08/03/2014.  FINDINGS: The support apparatus is stable. The left IJ catheter tip is in the right atrium and could be retracted 4 cm. The lungs demonstrate improved aeration with resolving edema and atelectasis.  IMPRESSION: Stable support apparatus. The left IJ catheter tip remains in the right atrium.  Improved lung aeration.   Electronically Signed   By: Loralie ChampagneMark  Gallerani M.D.   On: 08/04/2014 08:27   BMET    Component Value Date/Time   NA 146 08/05/2014 0415   K 4.7 08/05/2014 0415   CL 116* 08/05/2014 0415   CO2 16* 08/05/2014 0415   GLUCOSE 222*  08/05/2014 0415   BUN 53* 08/05/2014 0415   CREATININE 1.42* 08/05/2014 0415   CALCIUM 7.2* 08/05/2014 0415   GFRNONAA 60* 08/05/2014 0415   GFRAA 70* 08/05/2014 0415   CBC    Component Value Date/Time   WBC 4.4 08/05/2014 0415   RBC 2.74* 08/05/2014 0415   HGB 7.4* 08/05/2014 0415   HCT 23.5* 08/05/2014 0415   PLT 53* 08/05/2014 0415   MCV 85.8 08/05/2014 0415   MCH 27.0 08/05/2014 0415   MCHC 31.5 08/05/2014 0415   RDW 19.6* 08/05/2014 0415   LYMPHSABS 0.1* 08/05/2014 0415   MONOABS 0.1 08/05/2014 0415   EOSABS 0.0 08/05/2014 0415   BASOSABS 0.0 08/05/2014 0415     Assessment: 1.  SLE with DAH 2. CKD stable 3. HTN 4. Hypocalcemia.   Plan: 1. Attempting to wean from ventilator 2. [facility] 3. I was not going to plan plasmapheresis today   Thomas Andrews

## 2014-08-05 NOTE — Progress Notes (Signed)
ANTIBIOTIC CONSULT NOTE - FOLLOW UP  Pharmacy Consult for Vancomycin, Voriconazole Indication: VAP and aspergillus  Allergies  Allergen Reactions  . Levaquin [Levofloxacin In D5w] Other (See Comments)    Rash and SOB    Patient Measurements: Height: 5' 10.08" (178 cm) Weight: 193 lb 12.6 oz (87.9 kg) IBW/kg (Calculated) : 73.18 Adjusted Body Weight:   Vital Signs: Temp: 101 F (38.3 C) (10/11 1100) Temp Source: Axillary (10/11 0700) BP: 165/83 mmHg (10/11 1100) Pulse Rate: 128 (10/11 1100) Intake/Output from previous day: 10/10 0701 - 10/11 0700 In: 4093.9 [I.V.:823.9; NG/GT:1580; IV Piggyback:1690] Out: 2375 [Urine:2375] Intake/Output from this shift: Total I/O In: 317.2 [I.V.:47.2; NG/GT:270] Out: 335 [Urine:335]  Labs:  Recent Labs  08/03/14 0345  08/04/14 0441 08/04/14 1714 08/05/14 0415  WBC 1.3*  --  1.6*  --  4.4  HGB 7.4*  < > 7.1* 6.8* 7.4*  PLT 57*  --  49*  --  53*  CREATININE 1.63*  < > 1.51* 1.80* 1.42*  < > = values in this interval not displayed. Estimated Creatinine Clearance: 76.6 ml/min (by C-G formula based on Cr of 1.42). No results found for this basename: VANCOTROUGH, VANCOPEAK, VANCORANDOM, GENTTROUGH, GENTPEAK, GENTRANDOM, TOBRATROUGH, TOBRAPEAK, TOBRARND, AMIKACINPEAK, AMIKACINTROU, AMIKACIN,  in the last 72 hours   Microbiology: Recent Results (from the past 720 hour(s))  CULTURE, BLOOD (ROUTINE X 2)     Status: None   Collection Time    08/02/14 12:04 AM      Result Value Ref Range Status   Specimen Description BLOOD CENTRAL LINE   Final   Special Requests BOTTLES DRAWN AEROBIC AND ANAEROBIC 5CC EACH   Final   Culture  Setup Time     Final   Value: 08/02/2014 08:58     Performed at [facility]   Culture     Final   Value:        BLOOD CULTURE RECEIVED NO GROWTH TO DATE CULTURE WILL BE HELD FOR 5 DAYS BEFORE ISSUING A FINAL NEGATIVE REPORT     Performed at [facility]   Report Status PENDING   Incomplete   CULTURE, BLOOD (ROUTINE X 2)     Status: None   Collection Time    08/02/14 12:22 AM      Result Value Ref Range Status   Specimen Description BLOOD LEFT HAND   Final   Special Requests BOTTLES DRAWN AEROBIC ONLY 3CC   Final   Culture  Setup Time     Final   Value: 08/02/2014 08:57     Performed at [facility]   Culture     Final   Value:        BLOOD CULTURE RECEIVED NO GROWTH TO DATE CULTURE WILL BE HELD FOR 5 DAYS BEFORE ISSUING A FINAL NEGATIVE REPORT     Performed at [facility]   Report Status PENDING   Incomplete  CULTURE, BAL-QUANTITATIVE     Status: None   Collection Time    08/02/14 12:41 AM      Result Value Ref Range Status   Specimen Description BRONCHIAL ALVEOLAR LAVAGE   Final   Special Requests NONE   Final   Gram Stain     Final   Value: NO WBC SEEN     NO SQUAMOUS EPITHELIAL CELLS SEEN     NO ORGANISMS SEEN     Performed at [facility]   Colony Count     Final   Value: NO  GROWTH     Performed at [facility]   Culture     Final   Value: NO GROWTH 2 DAYS     Performed at [facility]   Report Status 08/04/2014 FINAL   Final  PNEUMOCYSTIS JIROVECI SMEAR BY DFA     Status: None   Collection Time    08/02/14 12:41 AM      Result Value Ref Range Status   Specimen Source-[facility] BRONCHIAL ALVEOLAR LAVAGE   Final   Pneumocystis jiroveci Ag NEGATIVE   Final   Comment: Performed at [facility] [place]  FUNGUS CULTURE W SMEAR     Status: None   Collection Time    08/02/14 12:41 AM      Result Value Ref Range Status   Specimen Description BRONCHIAL ALVEOLAR LAVAGE   Final   Special Requests NONE   Final   Fungal Smear     Final   Value: NO YEAST OR FUNGAL ELEMENTS SEEN     Performed at [facility]   Culture     Final   Value: CULTURE IN PROGRESS FOR FOUR WEEKS     Performed at [facility]   Report Status PENDING   Incomplete  URINE CULTURE     Status: None   Collection Time     08/02/14  1:30 AM      Result Value Ref Range Status   Specimen Description URINE, CATHETERIZED   Final   Special Requests Immunocompromised   Final   Culture  Setup Time     Final   Value: 08/02/2014 13:01     Performed at [facility] Count     Final   Value: NO GROWTH     Performed at [facility]   Culture     Final   Value: NO GROWTH     Performed at [facility]   Report Status 08/03/2014 FINAL   Final  CLOSTRIDIUM DIFFICILE BY PCR     Status: None   Collection Time    08/02/14  1:30 AM      Result Value Ref Range Status   C difficile by pcr NEGATIVE  NEGATIVE Final    Anti-infectives   Start     Dose/Rate Route Frequency Ordered Stop   08/03/14 1300  voriconazole (VFEND) 200 mg in sodium chloride 0.9 % 100 mL IVPB     200 mg 60 mL/hr over 120 Minutes Intravenous Every 12 hours 08/03/14 1257     08/02/14 2000  voriconazole (VFEND) 320 mg in sodium chloride 0.9 % 100 mL IVPB  Status:  Discontinued     4 mg/kg  81 kg 66 mL/hr over 120 Minutes Intravenous Every 12 hours 08/02/14 1047 08/02/14 1052   08/02/14 2000  voriconazole (VFEND) 200 mg in sodium chloride 0.9 % 100 mL IVPB  Status:  Discontinued     200 mg 60 mL/hr over 120 Minutes Intravenous Every 12 hours 08/02/14 1052 08/03/14 1257   08/02/14 1400  vancomycin (VANCOCIN) IVPB 750 mg/150 ml premix     750 mg 150 mL/hr over 60 Minutes Intravenous Every 12 hours 08/02/14 0152     08/02/14 1000  hydroxychloroquine (PLAQUENIL) tablet 400 mg     400 mg Per Tube Daily 08/01/14 2343     08/02/14 0830  atovaquone (MEPRON) 750 MG/5ML suspension 1,500 mg     1,500 mg Per Tube Daily with breakfast 08/01/14 2343  08/02/14 0800  voriconazole (VFEND) 200 mg in sodium chloride 0.9 % 100 mL IVPB  Status:  Discontinued     200 mg 60 mL/hr over 120 Minutes Intravenous Every 12 hours 08/02/14 0152 08/02/14 1047   08/02/14 0200  imipenem-cilastatin (PRIMAXIN) 500 mg in sodium chloride 0.9 % 100 mL  IVPB     500 mg 200 mL/hr over 30 Minutes Intravenous 3 times per day 08/02/14 0152     08/02/14 0200  vancomycin (VANCOCIN) 1,250 mg in sodium chloride 0.9 % 250 mL IVPB     1,250 mg 166.7 mL/hr over 90 Minutes Intravenous  Once 08/02/14 0152 08/02/14 0430      Assessment: 42 YOM who is chronically ill with lupus transferred [facility] ([facility] on 10/7 to IP rehab now intubated and sedated with respiratory failure.   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 Tmax 101, WBC 1.6>>4.4. PCT 3.25. Scr down to 1.42. Voriconazole IV and home po dose not equivalent dosing therefore need to adjust IV dosing--ok'd by Dr. Molli KnockYacoub  Primaxin 10/8>> Voriconazole 10/8 (2.2 mg/kg too low)>> 10/11 increase to 4mg /kg=350mg /12h Vancomycin 10/8>>  --10/11: VT 32.7: Atovaquone 10/8>>   10/8 Blood Cx x2>> ngtd 10/8 RSV>> 10/8 Urine Cx >> Negative 10/8 BAL>> Negative 10/8 BAL fungus>>  10/8: Pneumocystis: Negative  Cardiovascular: HTN. EF 45%. 165/83, HR 105-128. Pro BNP 5300. Troponin negative. IV Lasix  Endocrinology: No h/o DM; Glucose elevated 155-198 on SSI (steroid induced), On tube synthroid. Slow steroid taper  Gastrointestinal / Nutrition: Aminotransferases elevated. Vital TF. Change to PPI/tube. Trigly 179 (propofol off 10/11)  Neurology: Fent drip off, Propofol off, RASS 0, GCS 14, CPOT 0. Interactive, follows commands.  Nephrology: CKD with lupus nephritis. ARF, Scr down to 1.42, Ca 7.2 (ionized 0.95 low), s/p plasmapharesis 10/8, 10/9,10/10. UOP 2475 ml (1.2). IV Lasix. S/p CaGluconate 10/10 AM and PM but none 10/11. UOP 1.2 good.  Pulmonary: Acute hypoxemic respiratory failure with Hemoptysis in setting of Recent refractory lupus pneumonitis. Extubate 10/11.  Hematology / Oncology: Anemic, Hgb 7.4. Plts only 53. 10/9 Rituxin.  Meds: Atovaquone,Plaquenil, steroid taper, (Cytoxan due  10/17) (Rituxin due again ?)  PTA Medication Issues: amlodipine, carvedilol, doxazosin  Best Practices: PPI, SCDs   Goal of Therapy:  Vancomycin trough level 15-20 mcg/ml  Plan:  1. [facility]. 2. Increase Primaxin to 500mg  IV q6hrs. 3. Increase IV Voriconazole to 4mg /kg=350mg /12h. (home po dose not equivalent IV dosing)  4. Hold Vancomycin and check random level in AM.   Thomas Andrews, PharmD, BCPS Clinical Staff Pharmacist Pager [phone]  Thomas Andrews, Thomas Andrews 08/05/2014,11:57 AM

## 2014-08-05 NOTE — Procedures (Signed)
OGT i placed OGT with direct visualization after ett, easily passed Over 600 cc back Tf material To suction cont  [name], MD, FACP Pgr: [phone] [facility]

## 2014-08-06 LAB — RESPIRATORY VIRUS PANEL
Adenovirus: NOT DETECTED
INFLUENZA A H1: NOT DETECTED
INFLUENZA A: NOT DETECTED
Influenza A H3: NOT DETECTED
Influenza B: NOT DETECTED
Metapneumovirus: NOT DETECTED
Parainfluenza 1: NOT DETECTED
Parainfluenza 2: NOT DETECTED
Parainfluenza 3: NOT DETECTED
Respiratory Syncytial Virus A: NOT DETECTED
Respiratory Syncytial Virus B: NOT DETECTED
Rhinovirus: NOT DETECTED

## 2014-08-06 LAB — TYPE AND SCREEN
ABO/RH(D): A POS
ANTIBODY SCREEN: NEGATIVE
Unit division: 0

## 2014-08-06 LAB — GLUCOSE, CAPILLARY
GLUCOSE-CAPILLARY: 146 mg/dL — AB (ref 70–99)
GLUCOSE-CAPILLARY: 109 mg/dL — AB (ref 70–99)
Glucose-Capillary: 228 mg/dL — ABNORMAL HIGH (ref 70–99)

## 2014-08-07 LAB — CULTURE, RESPIRATORY W GRAM STAIN

## 2014-08-07 LAB — CULTURE, RESPIRATORY

## 2014-08-08 LAB — CULTURE, BLOOD (ROUTINE X 2)
Culture: NO GROWTH
Culture: NO GROWTH

## 2014-08-26 DEATH — deceased

## 2014-08-29 LAB — FUNGUS CULTURE W SMEAR: Fungal Smear: NONE SEEN

## 2015-05-31 IMAGING — CR DG CHEST 1V PORT
1 series · 1 of 1 positions shown · non-contrast
Comparison: August 02, 2014.

CLINICAL DATA: Respiratory failure.

EXAM:
PORTABLE CHEST - 1 VIEW

[portable]
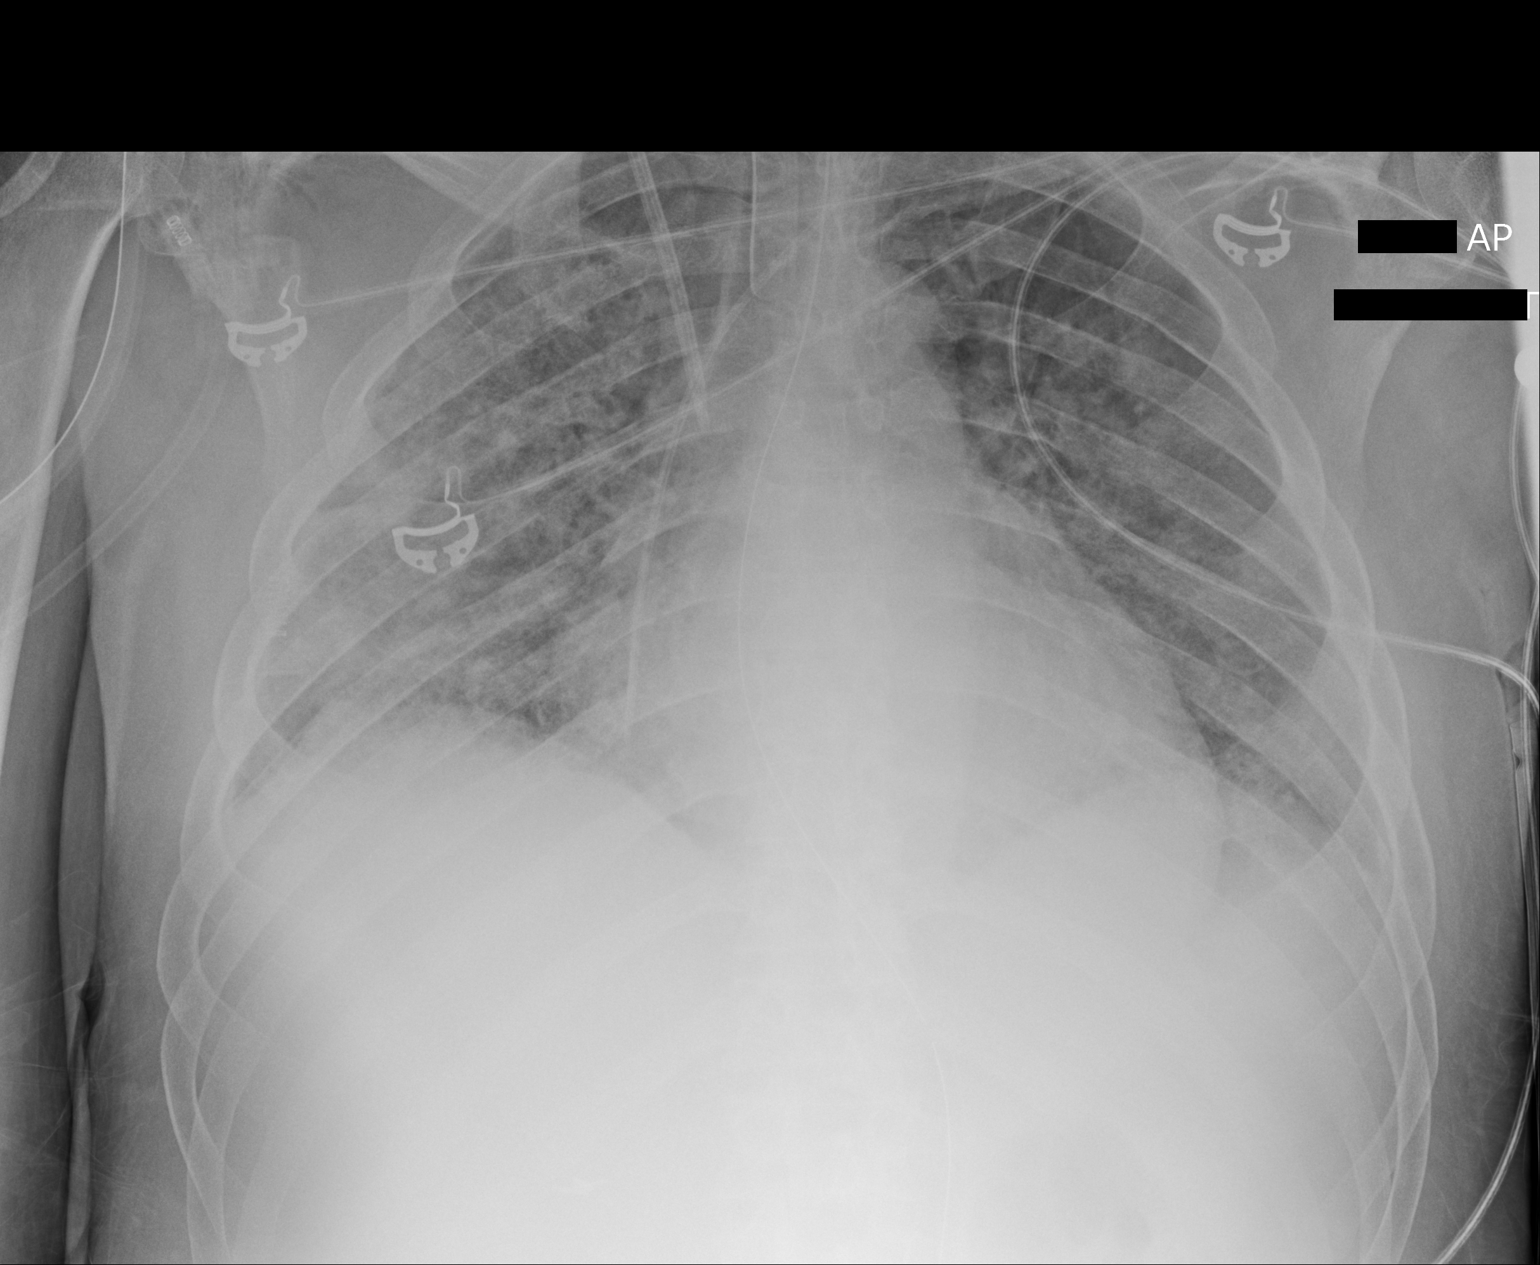

[1 of 1 positions shown; findings below may reference images not displayed]

FINDINGS: Stable cardiomediastinal silhouette. Endotracheal tube is in grossly
good position with distal tip 2.5 cm above the carina. Right
internal jugular venous sheath is unchanged in position. Left
internal jugular catheter line is unchanged in position. Nasogastric
tube is seen entering the stomach. No pneumothorax or significant
pleural effusion is noted. Stable bilateral lung opacities are noted
most consistent with pulmonary edema or possibly diffuse pneumonia.
Bony thorax is intact.
IMPRESSION: Stable support apparatus. Stable bilateral lung opacities concerning
for edema or pneumonia.

## 2015-06-02 IMAGING — CR DG CHEST 1V PORT
2 series · 2 of 2 positions shown · non-contrast
Comparison: [DATE] [DATE], [DATE] [DATE] a.m.

CLINICAL DATA: Patient has a history of SLE. Intubated due to low
O2 saturation today.

EXAM:
PORTABLE CHEST - 1 VIEW

[AP (1 of 2)]
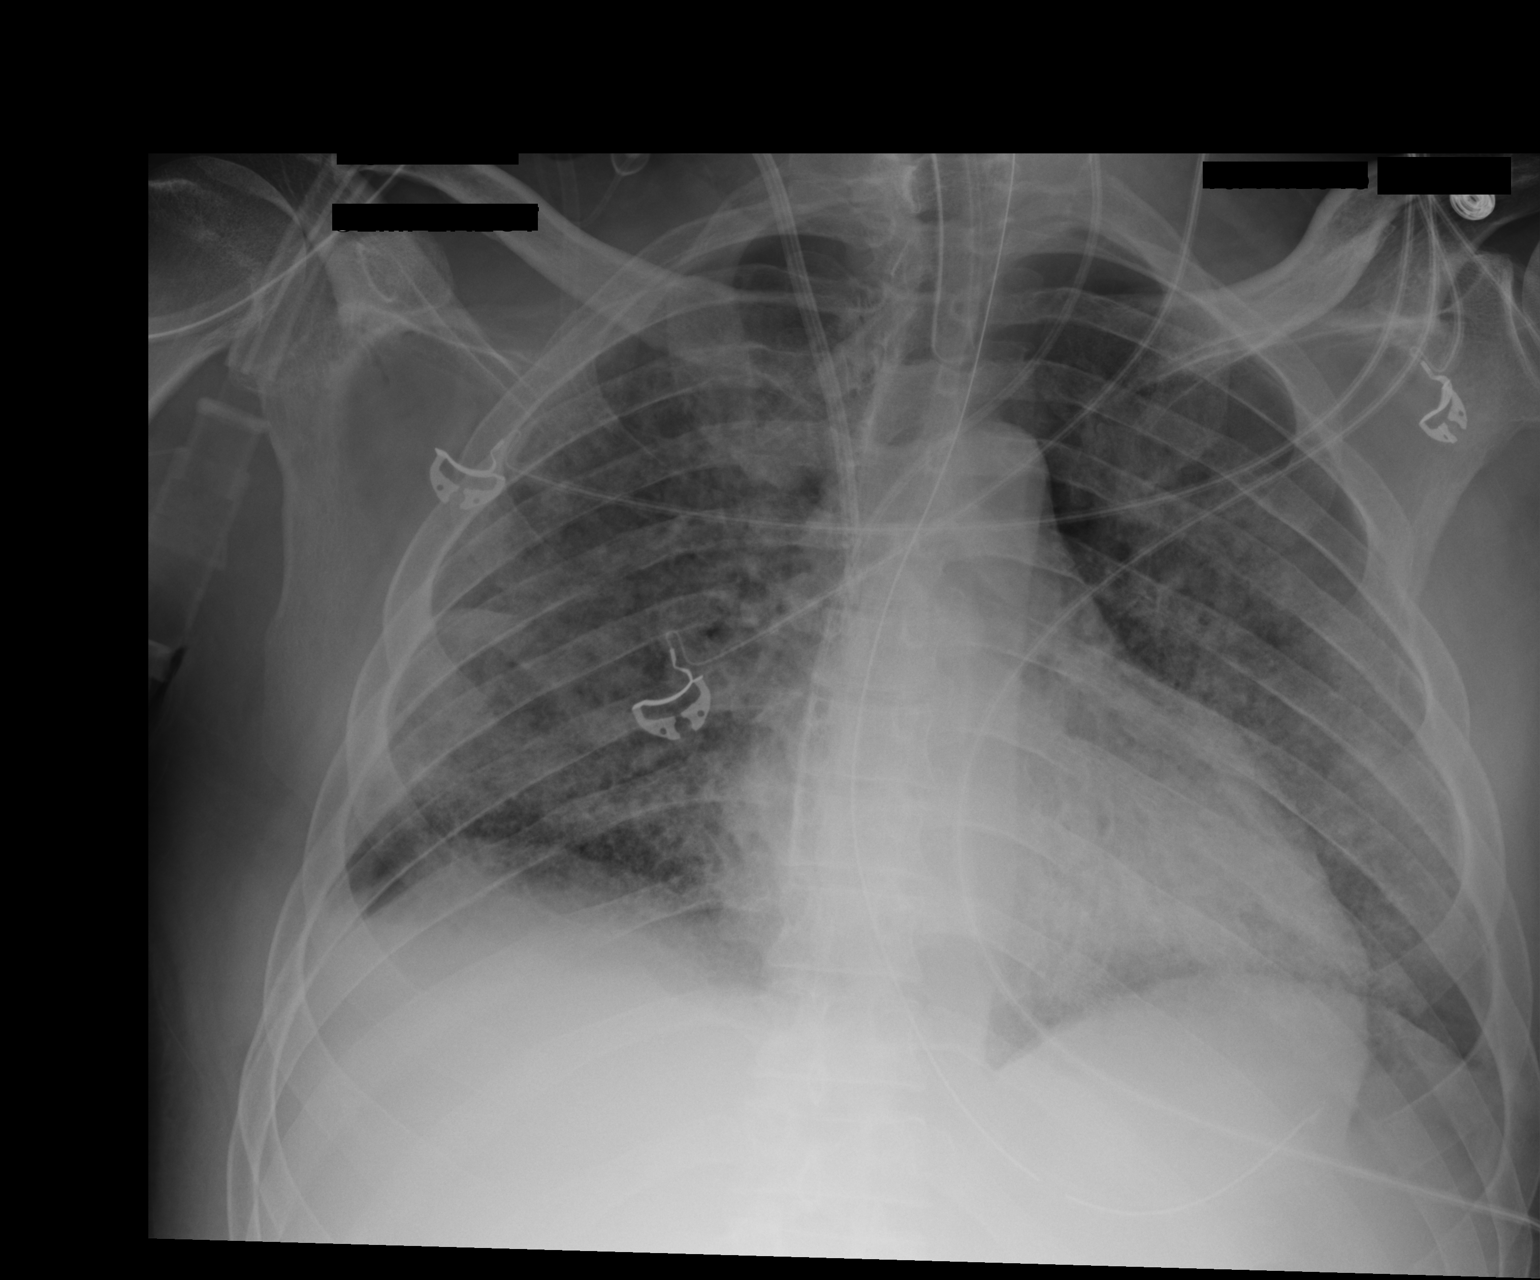

[AP (2 of 2)]
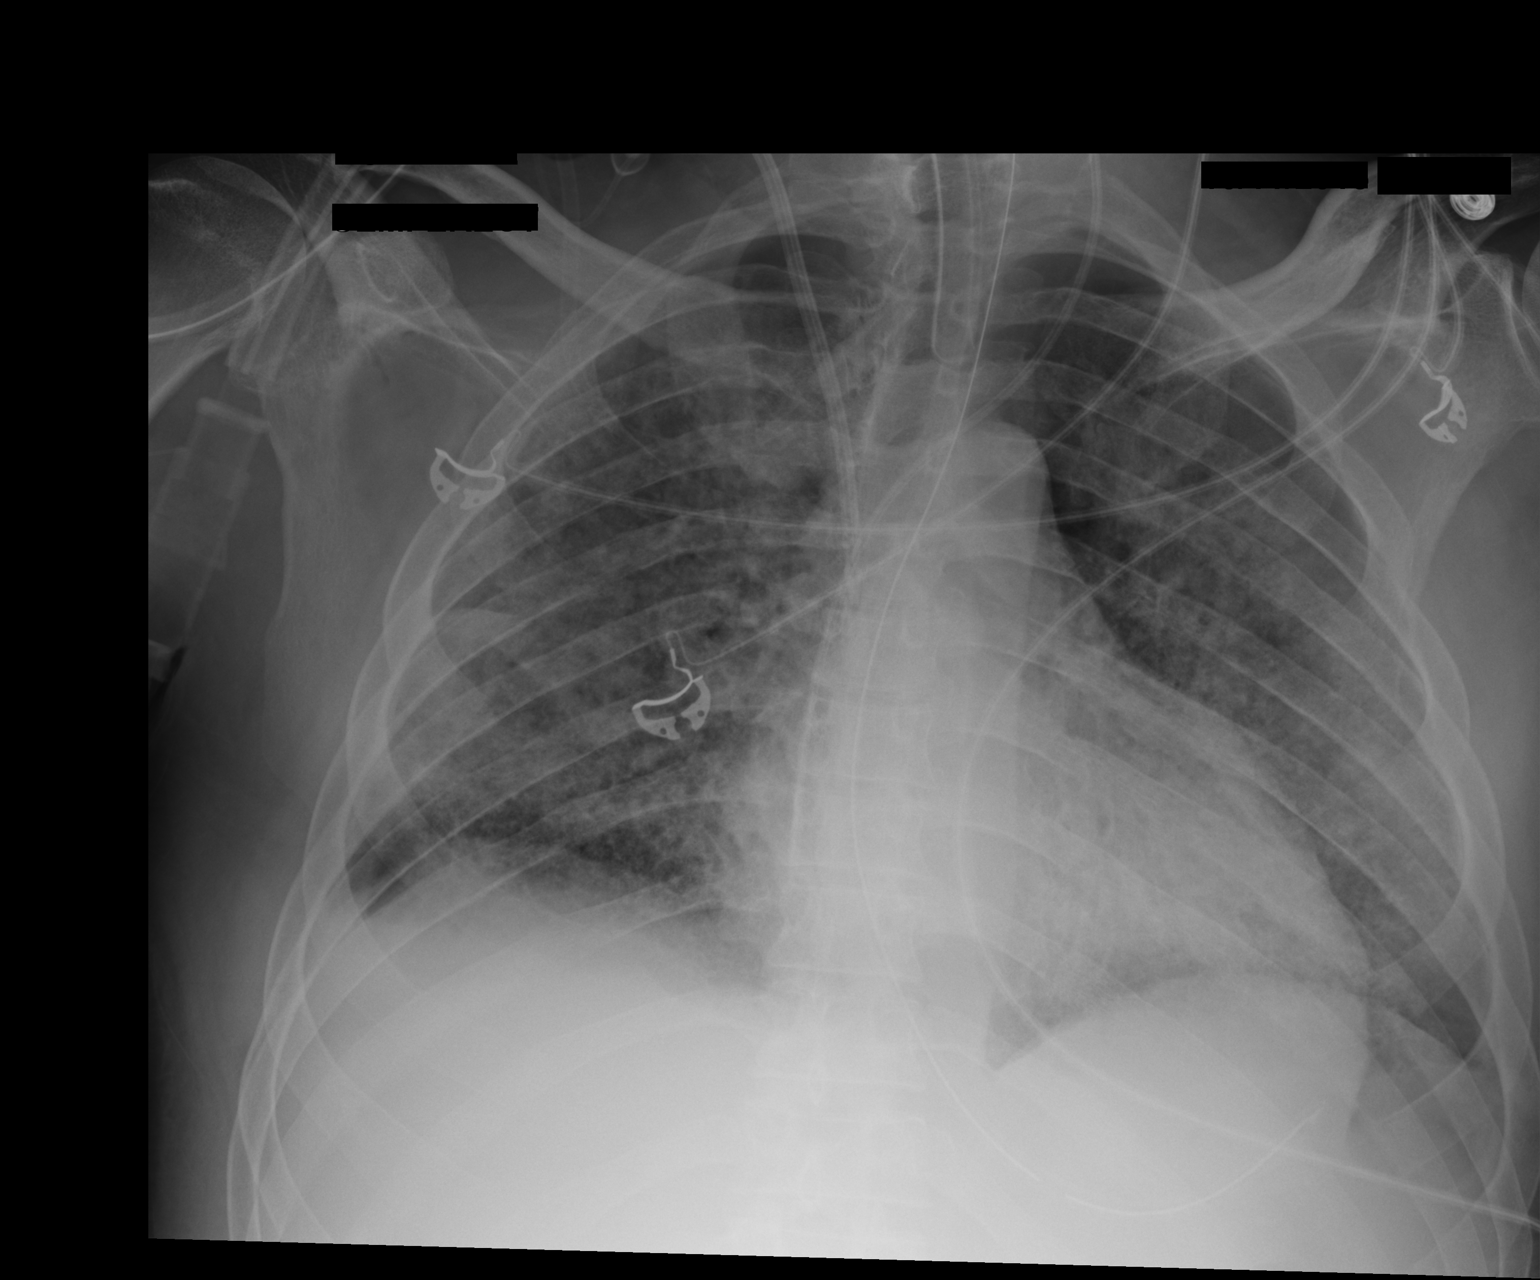

[2 of 2 positions shown; findings below may reference images not displayed]

FINDINGS: The mediastinal contour is normal. The heart size is enlarged.
Endotracheal tube is identified with distal tip 5.3 cm from carina.
There is no pneumothorax. Bilateral jugular central venous lines are
noted unchanged. Nasogastric tube is identified distal tip in the
fundus of stomach. Diffuse hazy opacity of bilateral lungs are
identified, slightly improved compared to prior exam. There is
probably a small right pleural effusion. The osseous structures are
stable.
IMPRESSION: Endotracheal tube with distal tip 5.3 cm from carina. There is no
pneumothorax.

Diffuse hazy opacity of bilateral lungs, slightly improved compared
to prior exam.
